# Patient Record
Sex: Female | Born: 1935 | Race: White | Hispanic: No | Marital: Married | Smoking: Never smoker
Health system: Southern US, Community
[De-identification: ages and names within clinical notes are randomized; demographics above are authoritative.]

---

## 1998-09-25 ENCOUNTER — Other Ambulatory Visit: Admission: RE | Admit: 1998-09-25 | Discharge: 1998-09-25 | Payer: Self-pay | Admitting: Obstetrics & Gynecology

## 2004-10-02 ENCOUNTER — Encounter: Admission: RE | Admit: 2004-10-02 | Discharge: 2004-10-02 | Payer: Self-pay | Admitting: General Surgery

## 2004-10-02 ENCOUNTER — Encounter (INDEPENDENT_AMBULATORY_CARE_PROVIDER_SITE_OTHER): Payer: Self-pay | Admitting: *Deleted

## 2004-10-02 ENCOUNTER — Encounter (INDEPENDENT_AMBULATORY_CARE_PROVIDER_SITE_OTHER): Payer: Self-pay | Admitting: General Surgery

## 2004-10-09 ENCOUNTER — Encounter: Admission: RE | Admit: 2004-10-09 | Discharge: 2004-10-09 | Payer: Self-pay | Admitting: General Surgery

## 2004-10-15 ENCOUNTER — Encounter: Admission: RE | Admit: 2004-10-15 | Discharge: 2004-10-15 | Payer: Self-pay | Admitting: General Surgery

## 2004-10-16 ENCOUNTER — Encounter (INDEPENDENT_AMBULATORY_CARE_PROVIDER_SITE_OTHER): Payer: Self-pay | Admitting: *Deleted

## 2004-10-16 ENCOUNTER — Encounter (INDEPENDENT_AMBULATORY_CARE_PROVIDER_SITE_OTHER): Payer: Self-pay | Admitting: General Surgery

## 2004-10-16 ENCOUNTER — Ambulatory Visit (HOSPITAL_COMMUNITY): Admission: RE | Admit: 2004-10-16 | Discharge: 2004-10-16 | Payer: Self-pay | Admitting: General Surgery

## 2004-10-16 ENCOUNTER — Ambulatory Visit (HOSPITAL_BASED_OUTPATIENT_CLINIC_OR_DEPARTMENT_OTHER): Admission: RE | Admit: 2004-10-16 | Discharge: 2004-10-16 | Payer: Self-pay | Admitting: General Surgery

## 2004-10-16 ENCOUNTER — Encounter: Admission: RE | Admit: 2004-10-16 | Discharge: 2004-10-16 | Payer: Self-pay | Admitting: General Surgery

## 2004-10-21 ENCOUNTER — Ambulatory Visit: Payer: Self-pay | Admitting: Oncology

## 2004-11-02 ENCOUNTER — Encounter (INDEPENDENT_AMBULATORY_CARE_PROVIDER_SITE_OTHER): Payer: Self-pay | Admitting: Specialist

## 2004-11-02 ENCOUNTER — Ambulatory Visit (HOSPITAL_COMMUNITY): Admission: RE | Admit: 2004-11-02 | Discharge: 2004-11-02 | Payer: Self-pay | Admitting: General Surgery

## 2004-11-02 ENCOUNTER — Ambulatory Visit (HOSPITAL_BASED_OUTPATIENT_CLINIC_OR_DEPARTMENT_OTHER): Admission: RE | Admit: 2004-11-02 | Discharge: 2004-11-02 | Payer: Self-pay | Admitting: General Surgery

## 2004-11-24 ENCOUNTER — Ambulatory Visit: Admission: RE | Admit: 2004-11-24 | Discharge: 2005-01-29 | Payer: Self-pay | Admitting: Radiation Oncology

## 2005-02-12 ENCOUNTER — Ambulatory Visit: Payer: Self-pay | Admitting: Oncology

## 2005-02-18 ENCOUNTER — Ambulatory Visit: Admission: RE | Admit: 2005-02-18 | Discharge: 2005-02-18 | Payer: Self-pay | Admitting: Radiation Oncology

## 2005-04-16 ENCOUNTER — Ambulatory Visit: Payer: Self-pay | Admitting: Oncology

## 2005-07-16 ENCOUNTER — Ambulatory Visit: Payer: Self-pay | Admitting: Oncology

## 2005-09-24 ENCOUNTER — Encounter: Admission: RE | Admit: 2005-09-24 | Discharge: 2005-09-24 | Payer: Self-pay | Admitting: Oncology

## 2005-11-15 ENCOUNTER — Ambulatory Visit: Payer: Self-pay | Admitting: Oncology

## 2006-03-22 ENCOUNTER — Ambulatory Visit: Payer: Self-pay | Admitting: Oncology

## 2006-03-28 LAB — CBC WITH DIFFERENTIAL/PLATELET
EOS%: 1.5 % (ref 0.0–7.0)
LYMPH%: 31.3 % (ref 14.0–48.0)
MCH: 31 pg (ref 26.0–34.0)
MCV: 89.2 fL (ref 81.0–101.0)
MONO%: 11 % (ref 0.0–13.0)
RBC: 4.37 10*6/uL (ref 3.70–5.32)
RDW: 12.6 % (ref 11.3–14.5)

## 2006-03-28 LAB — CANCER ANTIGEN 27.29: CA 27.29: 47 U/mL — ABNORMAL HIGH (ref 0–39)

## 2006-03-28 LAB — BASIC METABOLIC PANEL
BUN: 13 mg/dL (ref 6–23)
Calcium: 9.4 mg/dL (ref 8.4–10.5)
Potassium: 3.6 mEq/L (ref 3.5–5.3)
Sodium: 139 mEq/L (ref 135–145)

## 2006-06-23 ENCOUNTER — Ambulatory Visit: Payer: Self-pay | Admitting: Oncology

## 2006-06-28 LAB — CBC WITH DIFFERENTIAL/PLATELET
Basophils Absolute: 0 10*3/uL (ref 0.0–0.1)
Eosinophils Absolute: 0.1 10*3/uL (ref 0.0–0.5)
HCT: 38.3 % (ref 34.8–46.6)
HGB: 13.4 g/dL (ref 11.6–15.9)
LYMPH%: 36.1 % (ref 14.0–48.0)
MONO#: 0.8 10*3/uL (ref 0.1–0.9)
NEUT#: 3.7 10*3/uL (ref 1.5–6.5)
NEUT%: 50.7 % (ref 39.6–76.8)
Platelets: 277 10*3/uL (ref 145–400)
WBC: 7.3 10*3/uL (ref 3.9–10.0)

## 2006-06-28 LAB — COMPREHENSIVE METABOLIC PANEL
ALT: 15 U/L (ref 0–40)
AST: 21 U/L (ref 0–37)
Albumin: 4.3 g/dL (ref 3.5–5.2)
Alkaline Phosphatase: 105 U/L (ref 39–117)
Glucose, Bld: 100 mg/dL — ABNORMAL HIGH (ref 70–99)
Potassium: 3.9 mEq/L (ref 3.5–5.3)
Sodium: 139 mEq/L (ref 135–145)
Total Protein: 7.5 g/dL (ref 6.0–8.3)

## 2006-06-28 LAB — CANCER ANTIGEN 27.29: CA 27.29: 44 U/mL — ABNORMAL HIGH (ref 0–39)

## 2006-09-26 ENCOUNTER — Encounter: Admission: RE | Admit: 2006-09-26 | Discharge: 2006-09-26 | Payer: Self-pay | Admitting: Oncology

## 2006-11-04 ENCOUNTER — Encounter: Admission: RE | Admit: 2006-11-04 | Discharge: 2006-11-04 | Payer: Self-pay | Admitting: General Surgery

## 2006-11-28 IMAGING — CR DG CHEST 2V
2 series · 2 of 2 positions shown · non-contrast
Comparison: None.

CLINICAL DATA: Preop respiratory exam for breast cancer.
 TWO VIEW CHEST:

[view not recorded (1 of 2)]
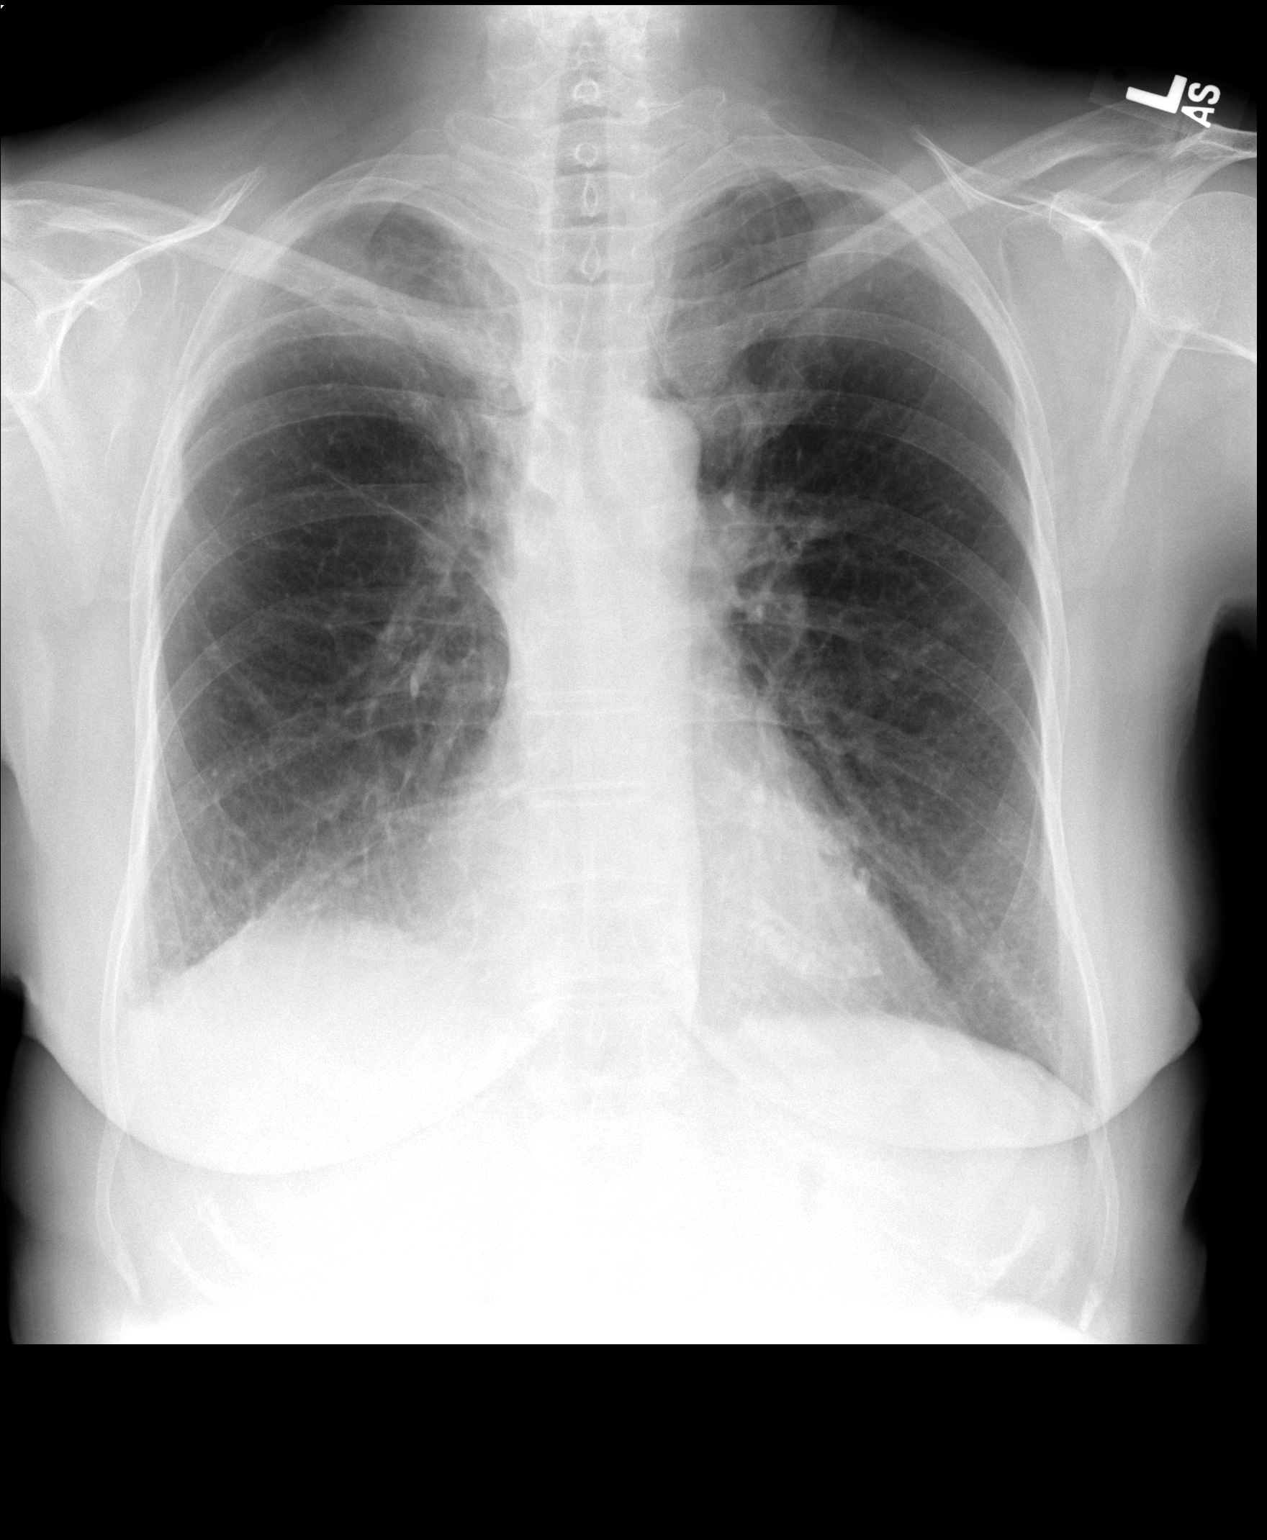

[view not recorded (2 of 2)]
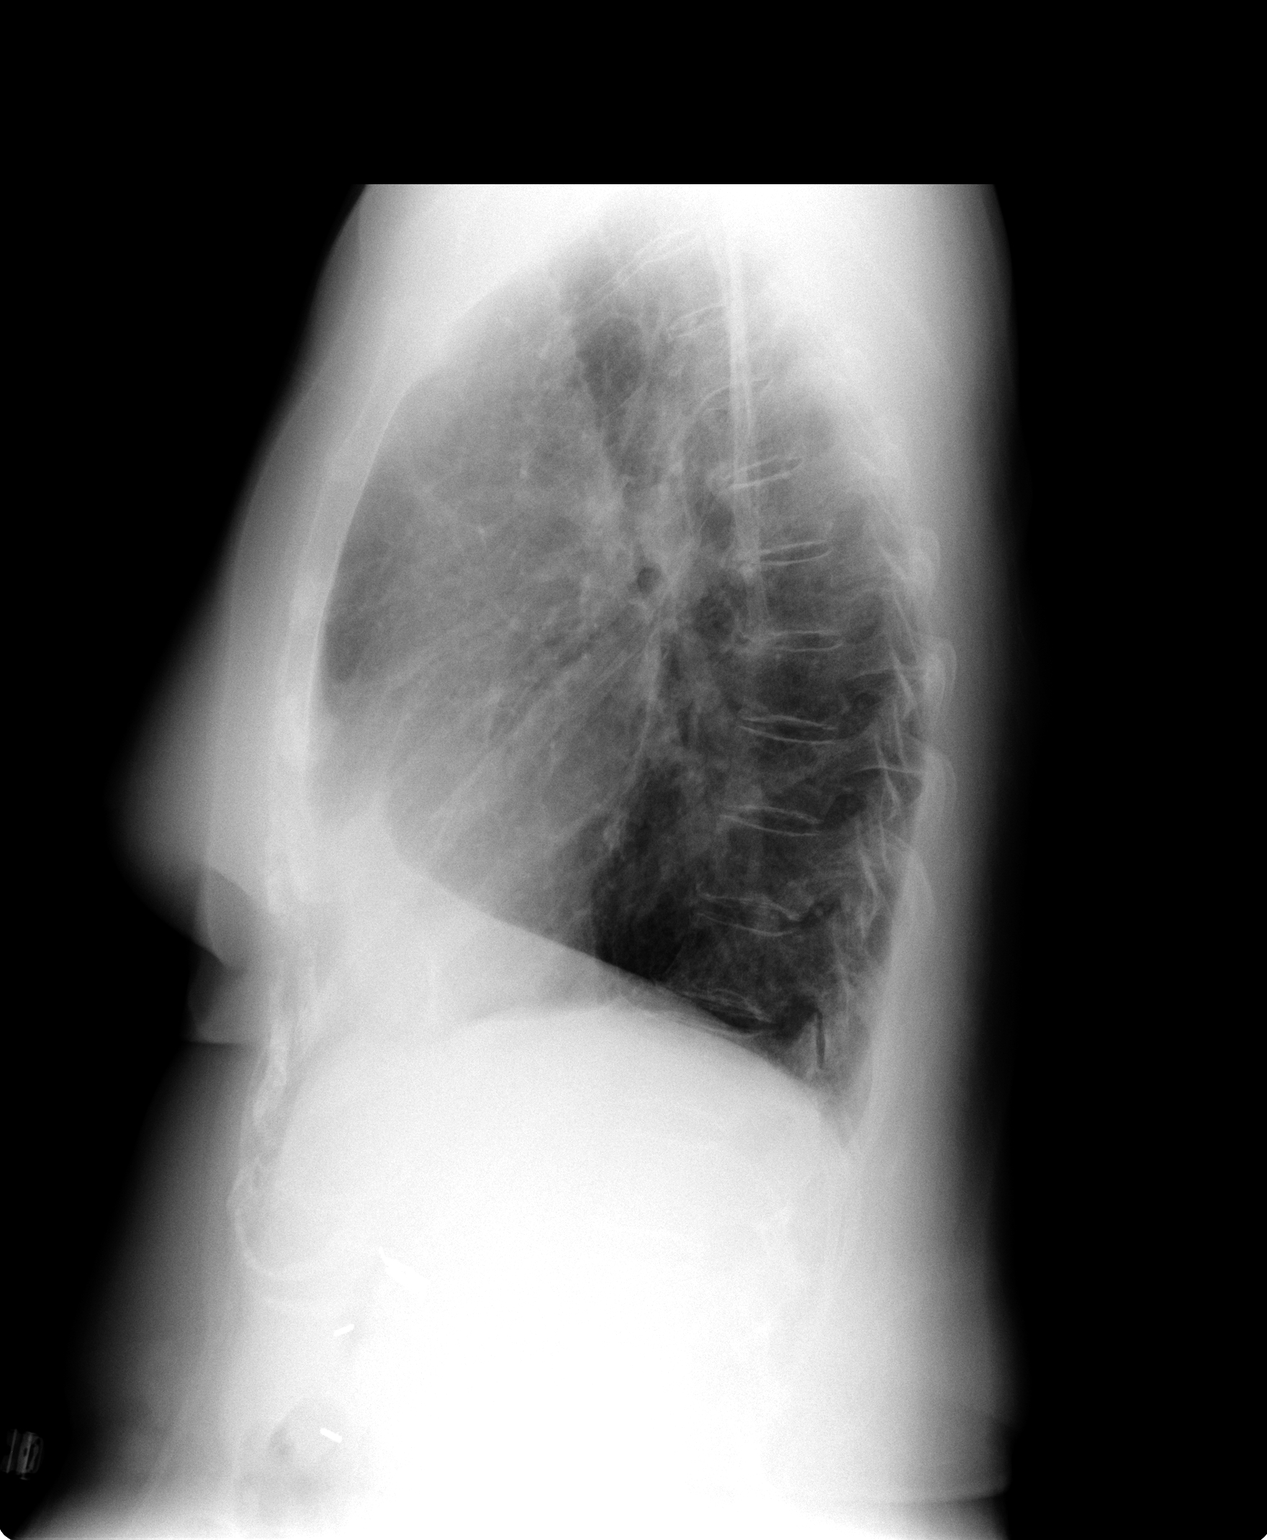

[2 of 2 positions shown; findings below may reference images not displayed]

FINDINGS: Heart size and contour are normal.  COPD with accentuated markings at both bases, right greater than left.  There is also some right upper lobe retraction with elevation of the minor fissure, and some right apical pleuroparenchymal scarring.  All of these changes are likely chronic, but I do not have prior films for comparison.  According to the history, the patient has a history of TB and certainly the changes in the right upper lobe suggest that diagnosis.   The patient also appears to have had resection of the right fourth rib.
IMPRESSION: Chronic changes, including likely old right upper lobe tuberculosis.  No definite acute process.

## 2006-12-29 ENCOUNTER — Ambulatory Visit: Payer: Self-pay | Admitting: Oncology

## 2007-01-03 LAB — CBC WITH DIFFERENTIAL/PLATELET
BASO%: 0.5 % (ref 0.0–2.0)
EOS%: 1.2 % (ref 0.0–7.0)
HCT: 39 % (ref 34.8–46.6)
HGB: 13.5 g/dL (ref 11.6–15.9)
MCHC: 34.6 g/dL (ref 32.0–36.0)
MONO#: 0.8 10*3/uL (ref 0.1–0.9)
NEUT%: 49.4 % (ref 39.6–76.8)
RDW: 12.5 % (ref 11.3–14.5)
WBC: 6.9 10*3/uL (ref 3.9–10.0)
lymph#: 2.6 10*3/uL (ref 0.9–3.3)

## 2007-01-03 LAB — COMPREHENSIVE METABOLIC PANEL
ALT: 15 U/L (ref 0–35)
AST: 22 U/L (ref 0–37)
Albumin: 4.3 g/dL (ref 3.5–5.2)
CO2: 29 mEq/L (ref 19–32)
Calcium: 9.5 mg/dL (ref 8.4–10.5)
Chloride: 100 mEq/L (ref 96–112)
Creatinine, Ser: 0.76 mg/dL (ref 0.40–1.20)
Potassium: 4.4 mEq/L (ref 3.5–5.3)
Total Protein: 7.9 g/dL (ref 6.0–8.3)

## 2007-01-03 LAB — CANCER ANTIGEN 27.29: CA 27.29: 47 U/mL — ABNORMAL HIGH (ref 0–39)

## 2007-07-07 ENCOUNTER — Ambulatory Visit: Payer: Self-pay | Admitting: Oncology

## 2007-07-11 LAB — COMPREHENSIVE METABOLIC PANEL
ALT: 13 U/L (ref 0–35)
Albumin: 4.3 g/dL (ref 3.5–5.2)
CO2: 26 mEq/L (ref 19–32)
Calcium: 9.4 mg/dL (ref 8.4–10.5)
Chloride: 96 mEq/L (ref 96–112)
Creatinine, Ser: 0.79 mg/dL (ref 0.40–1.20)
Total Protein: 7.9 g/dL (ref 6.0–8.3)

## 2007-07-11 LAB — CBC WITH DIFFERENTIAL/PLATELET
BASO%: 0.6 % (ref 0.0–2.0)
HCT: 38.6 % (ref 34.8–46.6)
HGB: 13.8 g/dL (ref 11.6–15.9)
MCHC: 35.7 g/dL (ref 32.0–36.0)
MONO#: 0.7 10*3/uL (ref 0.1–0.9)
NEUT%: 55.6 % (ref 39.6–76.8)
WBC: 7.8 10*3/uL (ref 3.9–10.0)
lymph#: 2.6 10*3/uL (ref 0.9–3.3)

## 2007-07-11 LAB — CANCER ANTIGEN 27.29: CA 27.29: 39 U/mL (ref 0–39)

## 2007-09-29 ENCOUNTER — Encounter: Admission: RE | Admit: 2007-09-29 | Discharge: 2007-09-29 | Payer: Self-pay | Admitting: Oncology

## 2008-01-05 ENCOUNTER — Ambulatory Visit: Payer: Self-pay | Admitting: Oncology

## 2008-07-09 ENCOUNTER — Ambulatory Visit: Payer: Self-pay | Admitting: Oncology

## 2008-07-11 LAB — COMPREHENSIVE METABOLIC PANEL
AST: 18 U/L (ref 0–37)
Alkaline Phosphatase: 97 U/L (ref 39–117)
BUN: 16 mg/dL (ref 6–23)
Glucose, Bld: 81 mg/dL (ref 70–99)
Total Bilirubin: 0.5 mg/dL (ref 0.3–1.2)

## 2008-07-11 LAB — CBC WITH DIFFERENTIAL/PLATELET
Basophils Absolute: 0 10*3/uL (ref 0.0–0.1)
EOS%: 1.5 % (ref 0.0–7.0)
Eosinophils Absolute: 0.1 10*3/uL (ref 0.0–0.5)
HGB: 13.2 g/dL (ref 11.6–15.9)
LYMPH%: 35.7 % (ref 14.0–48.0)
MCH: 31.4 pg (ref 26.0–34.0)
MCV: 90.2 fL (ref 81.0–101.0)
MONO%: 10.4 % (ref 0.0–13.0)
NEUT#: 3.3 10*3/uL (ref 1.5–6.5)
Platelets: 269 10*3/uL (ref 145–400)
RBC: 4.22 10*6/uL (ref 3.70–5.32)

## 2008-10-01 ENCOUNTER — Encounter: Admission: RE | Admit: 2008-10-01 | Discharge: 2008-10-01 | Payer: Self-pay | Admitting: Oncology

## 2009-01-06 ENCOUNTER — Ambulatory Visit: Payer: Self-pay | Admitting: Oncology

## 2009-01-08 LAB — CBC WITH DIFFERENTIAL/PLATELET
EOS%: 1.8 % (ref 0.0–7.0)
LYMPH%: 29.2 % (ref 14.0–49.7)
MCH: 31.8 pg (ref 25.1–34.0)
MCV: 91.8 fL (ref 79.5–101.0)
MONO%: 12.1 % (ref 0.0–14.0)
Platelets: 213 10*3/uL (ref 145–400)
RBC: 4.46 10*6/uL (ref 3.70–5.45)
RDW: 12.4 % (ref 11.2–14.5)

## 2009-01-08 LAB — COMPREHENSIVE METABOLIC PANEL
AST: 18 U/L (ref 0–37)
Albumin: 4.4 g/dL (ref 3.5–5.2)
Alkaline Phosphatase: 95 U/L (ref 39–117)
BUN: 18 mg/dL (ref 6–23)
Potassium: 4 mEq/L (ref 3.5–5.3)
Sodium: 138 mEq/L (ref 135–145)
Total Bilirubin: 0.6 mg/dL (ref 0.3–1.2)
Total Protein: 7.8 g/dL (ref 6.0–8.3)

## 2009-01-08 LAB — CANCER ANTIGEN 27.29: CA 27.29: 44 U/mL — ABNORMAL HIGH (ref 0–39)

## 2009-10-02 ENCOUNTER — Encounter: Admission: RE | Admit: 2009-10-02 | Discharge: 2009-10-02 | Payer: Self-pay | Admitting: Oncology

## 2010-01-16 ENCOUNTER — Ambulatory Visit: Payer: Self-pay | Admitting: Oncology

## 2010-01-20 LAB — COMPREHENSIVE METABOLIC PANEL
ALT: 14 U/L (ref 0–35)
AST: 22 U/L (ref 0–37)
Albumin: 4 g/dL (ref 3.5–5.2)
Alkaline Phosphatase: 78 U/L (ref 39–117)
BUN: 25 mg/dL — ABNORMAL HIGH (ref 6–23)
CO2: 29 mEq/L (ref 19–32)
Calcium: 9.4 mg/dL (ref 8.4–10.5)
Chloride: 103 mEq/L (ref 96–112)
Creatinine, Ser: 0.81 mg/dL (ref 0.40–1.20)
Glucose, Bld: 79 mg/dL (ref 70–99)
Potassium: 4.1 mEq/L (ref 3.5–5.3)
Sodium: 138 mEq/L (ref 135–145)
Total Bilirubin: 0.7 mg/dL (ref 0.3–1.2)
Total Protein: 8 g/dL (ref 6.0–8.3)

## 2010-01-20 LAB — CBC WITH DIFFERENTIAL/PLATELET
BASO%: 0.4 % (ref 0.0–2.0)
Basophils Absolute: 0 10*3/uL (ref 0.0–0.1)
EOS%: 2.6 % (ref 0.0–7.0)
Eosinophils Absolute: 0.2 10*3/uL (ref 0.0–0.5)
HCT: 41.5 % (ref 34.8–46.6)
HGB: 14.1 g/dL (ref 11.6–15.9)
LYMPH%: 30.3 % (ref 14.0–49.7)
MCH: 31.4 pg (ref 25.1–34.0)
MCHC: 33.8 g/dL (ref 31.5–36.0)
MCV: 92.8 fL (ref 79.5–101.0)
MONO#: 0.8 10*3/uL (ref 0.1–0.9)
MONO%: 11.5 % (ref 0.0–14.0)
NEUT#: 3.9 10*3/uL (ref 1.5–6.5)
NEUT%: 55.2 % (ref 38.4–76.8)
Platelets: 227 10*3/uL (ref 145–400)
RBC: 4.48 10*6/uL (ref 3.70–5.45)
RDW: 12.7 % (ref 11.2–14.5)
WBC: 7.1 10*3/uL (ref 3.9–10.3)
lymph#: 2.2 10*3/uL (ref 0.9–3.3)

## 2010-01-20 LAB — CANCER ANTIGEN 27.29: CA 27.29: 40 U/mL — ABNORMAL HIGH (ref 0–39)

## 2010-10-05 ENCOUNTER — Encounter
Admission: RE | Admit: 2010-10-05 | Discharge: 2010-10-05 | Payer: Self-pay | Source: Home / Self Care | Attending: Oncology | Admitting: Oncology

## 2010-10-28 ENCOUNTER — Ambulatory Visit: Payer: Self-pay | Admitting: Oncology

## 2011-03-08 ENCOUNTER — Other Ambulatory Visit: Payer: Self-pay | Admitting: Oncology

## 2011-03-08 ENCOUNTER — Encounter (HOSPITAL_BASED_OUTPATIENT_CLINIC_OR_DEPARTMENT_OTHER): Payer: Medicare Other | Admitting: Oncology

## 2011-03-08 DIAGNOSIS — Z170 Estrogen receptor positive status [ER+]: Secondary | ICD-10-CM

## 2011-03-08 DIAGNOSIS — Z803 Family history of malignant neoplasm of breast: Secondary | ICD-10-CM

## 2011-03-08 DIAGNOSIS — Z9889 Other specified postprocedural states: Secondary | ICD-10-CM

## 2011-03-08 DIAGNOSIS — C50119 Malignant neoplasm of central portion of unspecified female breast: Secondary | ICD-10-CM

## 2011-03-08 LAB — COMPREHENSIVE METABOLIC PANEL
ALT: 13 U/L (ref 0–35)
AST: 23 U/L (ref 0–37)
Albumin: 4.2 g/dL (ref 3.5–5.2)
Alkaline Phosphatase: 87 U/L (ref 39–117)
BUN: 19 mg/dL (ref 6–23)
Calcium: 9.2 mg/dL (ref 8.4–10.5)
Chloride: 95 mEq/L — ABNORMAL LOW (ref 96–112)
Creatinine, Ser: 0.69 mg/dL (ref 0.40–1.20)
Potassium: 3.9 mEq/L (ref 3.5–5.3)
Sodium: 133 mEq/L — ABNORMAL LOW (ref 135–145)
Total Bilirubin: 0.6 mg/dL (ref 0.3–1.2)
Total Protein: 7.8 g/dL (ref 6.0–8.3)

## 2011-03-08 LAB — CBC WITH DIFFERENTIAL/PLATELET
BASO%: 0.4 % (ref 0.0–2.0)
Basophils Absolute: 0 10*3/uL (ref 0.0–0.1)
EOS%: 1.5 % (ref 0.0–7.0)
HCT: 41.1 % (ref 34.8–46.6)
HGB: 14.1 g/dL (ref 11.6–15.9)
LYMPH%: 33.4 % (ref 14.0–49.7)
MCH: 31.6 pg (ref 25.1–34.0)
MCHC: 34.4 g/dL (ref 31.5–36.0)
MCV: 91.9 fL (ref 79.5–101.0)
MONO%: 12.2 % (ref 0.0–14.0)
NEUT%: 52.5 % (ref 38.4–76.8)
Platelets: 187 10*3/uL (ref 145–400)
RBC: 4.47 10*6/uL (ref 3.70–5.45)
RDW: 12.8 % (ref 11.2–14.5)
WBC: 5.7 10*3/uL (ref 3.9–10.3)
lymph#: 1.9 10*3/uL (ref 0.9–3.3)

## 2011-03-12 NOTE — Op Note (Signed)
Hailey Werner, Hailey Werner               ACCOUNT NO.:  [id number]   MEDICAL RECORD NO.:  [id number]          PATIENT TYPE:  AMB   LOCATION:  DSC                          FACILITY:  [facility]   PHYSICIAN:  Rose Phi. Maple Hudson, M.D.   DATE OF BIRTH:  06/22/1927   DATE OF PROCEDURE:  11/02/2004  DATE OF DISCHARGE:                                 OPERATIVE REPORT   PREOPERATIVE DIAGNOSIS:  T1BN0 carcinoma of the left breast.   POSTOPERATIVE DIAGNOSIS:  T1BN0 carcinoma of the left breast.   OPERATION:  Re-excision medial portion of lumpectomy site for margin.   SURGEON:  Rose Phi. Maple Hudson, M.D.   ANESTHESIA:  General.   OPERATIVE PROCEDURE:  After suitable general anesthesia was induced, the  patient was placed in supine position with the arms extended on an arm  board. The left breast was prepped and draped in usual fashion. I opened the  old lumpectomy incision in the medial portion of the left breast and then  evacuated the seroma and then thoroughly irrigated it.  It was the medial  margin that was in question, so I excised that margin. Hemostasis obtained  with cautery. We then thoroughly again irrigated the space.   Incision was then closed with 3-0 Vicryl and subcuticular 4-0 Monocryl and  Steri-Strips.   Dressing applied. The patient transferred to recovery room in satisfactory  condition having tolerated procedure well.      Pete   PRY/MEDQ  D:  11/02/2004  T:  11/02/2004  Job:  [id number]

## 2011-03-26 ENCOUNTER — Encounter (INDEPENDENT_AMBULATORY_CARE_PROVIDER_SITE_OTHER): Payer: Self-pay | Admitting: General Surgery

## 2011-09-20 ENCOUNTER — Telehealth (INDEPENDENT_AMBULATORY_CARE_PROVIDER_SITE_OTHER): Payer: Self-pay | Admitting: General Surgery

## 2011-09-23 ENCOUNTER — Ambulatory Visit (INDEPENDENT_AMBULATORY_CARE_PROVIDER_SITE_OTHER): Payer: Self-pay | Admitting: General Surgery

## 2011-10-07 ENCOUNTER — Ambulatory Visit
Admission: RE | Admit: 2011-10-07 | Discharge: 2011-10-07 | Disposition: A | Discharge status: Home / Self Care | Payer: Medicare Other | Source: Ambulatory Visit | Attending: Oncology | Admitting: Oncology

## 2011-10-07 DIAGNOSIS — Z9889 Other specified postprocedural states: Secondary | ICD-10-CM

## 2011-11-29 ENCOUNTER — Telehealth: Payer: Self-pay | Admitting: Oncology

## 2011-11-29 NOTE — Telephone Encounter (Signed)
Pt called to schedule her yrly appt since she is due in may 2013. Pt requested a sooner appt due to she has missed her appts with the internal md

## 2012-01-10 ENCOUNTER — Ambulatory Visit (HOSPITAL_BASED_OUTPATIENT_CLINIC_OR_DEPARTMENT_OTHER): Payer: Medicare Other | Admitting: Oncology

## 2012-01-10 ENCOUNTER — Telehealth: Payer: Self-pay | Admitting: Oncology

## 2012-01-10 ENCOUNTER — Other Ambulatory Visit (HOSPITAL_BASED_OUTPATIENT_CLINIC_OR_DEPARTMENT_OTHER): Payer: Medicare Other | Admitting: Lab

## 2012-01-10 VITALS — BP 174/72 | HR 67 | Temp 97.6°F | Ht 64.0 in | Wt 132.4 lb

## 2012-01-10 DIAGNOSIS — Z853 Personal history of malignant neoplasm of breast: Secondary | ICD-10-CM

## 2012-01-10 DIAGNOSIS — C50919 Malignant neoplasm of unspecified site of unspecified female breast: Secondary | ICD-10-CM

## 2012-01-10 DIAGNOSIS — C50119 Malignant neoplasm of central portion of unspecified female breast: Secondary | ICD-10-CM

## 2012-01-10 DIAGNOSIS — Z170 Estrogen receptor positive status [ER+]: Secondary | ICD-10-CM

## 2012-01-10 LAB — COMPREHENSIVE METABOLIC PANEL
ALT: 12 U/L (ref 0–35)
Albumin: 4.2 g/dL (ref 3.5–5.2)
CO2: 32 mEq/L (ref 19–32)
Calcium: 9.9 mg/dL (ref 8.4–10.5)
Chloride: 91 mEq/L — ABNORMAL LOW (ref 96–112)
Creatinine, Ser: 0.65 mg/dL (ref 0.50–1.10)
Potassium: 4.4 mEq/L (ref 3.5–5.3)
Sodium: 130 mEq/L — ABNORMAL LOW (ref 135–145)
Total Protein: 8.6 g/dL — ABNORMAL HIGH (ref 6.0–8.3)

## 2012-01-10 LAB — CBC WITH DIFFERENTIAL/PLATELET
BASO%: 0.4 % (ref 0.0–2.0)
HCT: 41.2 % (ref 34.8–46.6)
MCHC: 33.7 g/dL (ref 31.5–36.0)
MONO#: 1 10*3/uL — ABNORMAL HIGH (ref 0.1–0.9)
NEUT%: 65.4 % (ref 38.4–76.8)
WBC: 8.2 10*3/uL (ref 3.9–10.3)
lymph#: 1.8 10*3/uL (ref 0.9–3.3)

## 2012-01-10 LAB — CANCER ANTIGEN 27.29: CA 27.29: 51 U/mL — ABNORMAL HIGH (ref 0–39)

## 2012-01-10 NOTE — Progress Notes (Signed)
ID: Efraim Kaufmann   DOB: 31-Jul-1927  MR#: [id number]  WUJ#:[id number]  HISTORY OF PRESENT ILLNESS: The patient had a routine mammogram at [facility] on 09-14-04 which showed a nodule centrally in the left breast, not seen previously.  She was referred [facility] where on 10-02-04 she had an ultrasound of the left breast which did not show the spiculated lesion seen by mammography.  Biopsy was obtained on 10-02-04 and showed ([id number]) an invasive mammary carcinoma that was ER positive, weakly PR positive with a low proliferation marker and borderline on HER-2 (the final HER-2 is still pending).  With this information, the patient proceeded to resection under Dr. Francina Ames and on 10-16-04 underwent left lumpectomy with sentinel lymph node biopsy.  This showed ([id number]) a 0.8 cm. infiltrating ductal carcinoma with a second 0.3 cm. focus in the same quadrant.  Margins were focally involved.  There was no lymphovascular invasion. The Grade was 1 and in the sentinel lymph node there was a 0.4 mm. micrometastasis seen only by cytokeratin.  The patient accordingly stages as T1a, N0 (i+)  She had a second resection for a clear margin which was obtained on 11-02-04 ([id number]).   INTERVAL HISTORY: The patient returns for routine followup of her breast cancer. Interval history is unremarkable. Family is doing well, including her husband who has a history of metastatic Merckel cell carcinoma  REVIEW OF SYSTEMS: She continues to have problems with irritable bowel, but this is nothing new. Sometimes she has a little bit of pain over the surgical site. This comes and goes, is very brief coming constant, and has not really changed in intensity or frequency in many years. A detailed review of systems is otherwise noncontributory  PAST MEDICAL HISTORY: Significant for mild hypercholesterolemia, osteopenia/osteoporosis (she is not quite sure of the extent of bone loss), mild osteoarthritis,  coronary artery disease followed by Dr. Dulce Sellar in [place], COPD, status post remote tobacco abuse but quitting approximately 22 years ago, and less than 10 pack years total, hypertension controlled on medication, history of tuberculosis at 78, in a sanitorium from September 1954 to November 1955, status post removal of her left upper lobe at that time, history of colonic polyps, history of mucus colitis, history of glaucoma, history of bilateral cataract surgery, history of cholecystectomy, history of appendectomy, history of simple hysterectomy without salpingo-oophorectomy and history of "mini strokes" (TIA's apparently).    FAMILY HISTORY The patient's father died at the age of 76 from a stroke.  The patient's mother died at the age of 76 with breast cancer which was diagnosed at age 76. The patient has one sister who died with breast cancer, also diagnosed at age 76.  Another sister died from a stroke.  One brother died from lung cancer and a brother died at age 84 after cardiac surgery.   GYNECOLOGIC HISTORY: She is G3, P2. She lost a pair of twins at age 78, first delivery to term age 32.  She had her hysterectomy at age 69. She never took hormone therapy.   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  Their daughter, Donzetta Kohut, works in OT at [employer] and their son, Brett Canales, age 76, lives in [place] and works for [employer].  The patient has three grandchildren. She is a member of the [employer].   ADVANCED DIRECTIVES:  HEALTH MAINTENANCE: History  Substance Use Topics  . Smoking  status: Unknown If Ever Smoked  . Smokeless tobacco: Not on file  . Alcohol Use: No     Colonoscopy: (Tim Misenheimer)  PAP:  Lipid panel: Norman Herrlich)  Allergies  Allergen Reactions  . Codeine   . Sulfur     Current Outpatient Prescriptions  Medication Sig Dispense Refill  . Candesartan  Cilexetil (ATACAND PO) Take by mouth.        . Carboxymethylcellulose Sodium (THERATEARS OP) Apply to eye as needed.      Marland Kitchen Clopidogrel Bisulfate (PLAVIX PO) Take by mouth.        . diltiazem (DILACOR XR) 120 MG 24 hr capsule Take 120 mg by mouth daily. Not sure of dose        OBJECTIVE: Elderly white female who appears well  Filed Vitals:   01/10/12 1353  BP: 174/72  Pulse: 67  Temp: 97.6 F (36.4 C)     Body mass index is 22.73 kg/(m^2).    ECOG FS: 1  Sclerae unicteric Oropharynx clear No peripheral adenopathy Lungs no rales or rhonchi Heart regular rate and rhythm Abd benign MSK no focal spinal tenderness, no peripheral edema Neuro: nonfocal Breasts: Right breast no suspicious findings; left breast status post lumpectomy; no evidence of local recurrence  LAB RESULTS: Lab Results  Component Value Date   WBC 8.2 01/10/2012   NEUTROABS 5.4 01/10/2012   HGB 13.9 01/10/2012   HCT 41.2 01/10/2012   MCV 92.1 01/10/2012   PLT 243 01/10/2012      Chemistry      Component Value Date/Time   NA 133* 03/08/2011 1028   K 3.9 03/08/2011 1028   CL 95* 03/08/2011 1028   CO2 28 03/08/2011 1028   BUN 19 03/08/2011 1028   CREATININE 0.69 03/08/2011 1028      Component Value Date/Time   CALCIUM 9.2 03/08/2011 1028   ALKPHOS 87 03/08/2011 1028   AST 23 03/08/2011 1028   ALT 13 03/08/2011 1028   BILITOT 0.6 03/08/2011 1028       Lab Results  Component Value Date   LABCA2 42* 03/08/2011    No components found with this basename: [id number]    No results found for this basename: INR:1;PROTIME:1 in the last 168 hours  Urinalysis No results found for this basename: colorurine, appearanceur, labspec, phurine, glucoseu, hgbur, bilirubinur, ketonesur, proteinur, urobilinogen, nitrite, leukocytesur    STUDIES: Unremarkable mammography December of 20  ASSESSMENT:An 78 year old [place], [place] woman status post left lumpectomy and sentinel lymph node biopsy in December 2006 for  multifocal invasive ductal carcinoma measuring maximal 8 mm.  No lymphovascular invasion, no lymph node involvement.  Grade 1, ER positive, PR weakly positive and HER2/neu negative.  Did not tolerate Aromasin or Arimidex.  Now on observation alone given cardiac history.    PLAN: The patient is doing very well from a breast cancer point of view and indeed she would be a good candidate for our survivorship clinic, except that she prefers to be seen on a once a year basis as before. She will see my physician's assistant again in one year. She will have a repeat mammography December of this year. She knows to call for any problems that may develop before the next visit   Kwali Wrinkle C    01/10/2012

## 2012-01-10 NOTE — Telephone Encounter (Signed)
gve the pt her march 2014 appt calendar °

## 2012-01-18 ENCOUNTER — Telehealth: Payer: Self-pay | Admitting: *Deleted

## 2012-01-18 NOTE — Telephone Encounter (Signed)
Message left by pt inquiring of result of most recent CA 27.29.  Noted elevation from prior reading.  This note with lab result will be given to MD for review and any recommendation for pt information.

## 2012-01-20 ENCOUNTER — Other Ambulatory Visit: Payer: Self-pay | Admitting: Oncology

## 2012-01-20 DIAGNOSIS — C50919 Malignant neoplasm of unspecified site of unspecified female breast: Secondary | ICD-10-CM

## 2012-01-20 NOTE — Progress Notes (Signed)
I called the pt. w her lab results and gave her the CA 27 values for the past several years--there is no clear treand--we are going to check labs in 6 months, no visit; she will see me again in one year

## 2012-01-21 ENCOUNTER — Telehealth: Payer: Self-pay | Admitting: Oncology

## 2012-01-21 NOTE — Telephone Encounter (Signed)
S/w the pt and she is aware of her lab appt in sept °

## 2012-01-31 ENCOUNTER — Other Ambulatory Visit: Payer: Self-pay | Admitting: Oncology

## 2012-07-10 ENCOUNTER — Other Ambulatory Visit (HOSPITAL_BASED_OUTPATIENT_CLINIC_OR_DEPARTMENT_OTHER): Payer: Medicare Other | Admitting: Lab

## 2012-07-10 DIAGNOSIS — C50919 Malignant neoplasm of unspecified site of unspecified female breast: Secondary | ICD-10-CM

## 2012-07-10 DIAGNOSIS — Z853 Personal history of malignant neoplasm of breast: Secondary | ICD-10-CM

## 2012-07-10 LAB — CBC WITH DIFFERENTIAL/PLATELET
BASO%: 0.9 % (ref 0.0–2.0)
EOS%: 2.5 % (ref 0.0–7.0)
MCH: 30.4 pg (ref 25.1–34.0)
MCHC: 33.1 g/dL (ref 31.5–36.0)
MCV: 91.8 fL (ref 79.5–101.0)
MONO%: 11.8 % (ref 0.0–14.0)
RBC: 4.33 10*6/uL (ref 3.70–5.45)
RDW: 13.2 % (ref 11.2–14.5)
lymph#: 3.1 10*3/uL (ref 0.9–3.3)

## 2012-07-10 LAB — COMPREHENSIVE METABOLIC PANEL (CC13)
AST: 20 U/L (ref 5–34)
Albumin: 3.6 g/dL (ref 3.5–5.0)
Alkaline Phosphatase: 92 U/L (ref 40–150)
Glucose: 97 mg/dl (ref 70–99)
Potassium: 4.4 mEq/L (ref 3.5–5.1)
Sodium: 138 mEq/L (ref 136–145)
Total Bilirubin: 0.5 mg/dL (ref 0.20–1.20)
Total Protein: 7.9 g/dL (ref 6.4–8.3)

## 2012-09-12 ENCOUNTER — Other Ambulatory Visit: Payer: Self-pay | Admitting: Oncology

## 2012-09-12 DIAGNOSIS — Z853 Personal history of malignant neoplasm of breast: Secondary | ICD-10-CM

## 2012-10-11 ENCOUNTER — Ambulatory Visit
Admission: RE | Admit: 2012-10-11 | Discharge: 2012-10-11 | Disposition: A | Discharge status: Home / Self Care | Payer: Medicare Other | Source: Ambulatory Visit | Attending: Oncology | Admitting: Oncology

## 2012-10-11 ENCOUNTER — Other Ambulatory Visit: Payer: Self-pay | Admitting: Oncology

## 2012-10-11 DIAGNOSIS — Z853 Personal history of malignant neoplasm of breast: Secondary | ICD-10-CM

## 2013-01-09 ENCOUNTER — Other Ambulatory Visit: Payer: Medicare Other | Admitting: Lab

## 2013-01-09 ENCOUNTER — Encounter: Payer: Self-pay | Admitting: Physician Assistant

## 2013-01-09 ENCOUNTER — Ambulatory Visit: Payer: Medicare Other | Admitting: Physician Assistant

## 2013-01-09 NOTE — Progress Notes (Signed)
[facility] today.  Letter mailed to patient.   Zollie Scale, PA-C 01/09/2013

## 2013-01-23 ENCOUNTER — Other Ambulatory Visit: Payer: Self-pay | Admitting: *Deleted

## 2013-01-23 DIAGNOSIS — C50919 Malignant neoplasm of unspecified site of unspecified female breast: Secondary | ICD-10-CM

## 2013-01-24 ENCOUNTER — Ambulatory Visit: Payer: Medicare Other | Admitting: Physician Assistant

## 2013-01-24 ENCOUNTER — Other Ambulatory Visit: Payer: Medicare Other | Admitting: Lab

## 2013-01-24 ENCOUNTER — Telehealth: Payer: Self-pay | Admitting: *Deleted

## 2013-01-24 NOTE — Telephone Encounter (Signed)
Called pt to let her know that Amy is sick and cannot be seen today.  Confirmed rescheduled 01/31/13 appt w/ pt.

## 2013-01-25 ENCOUNTER — Other Ambulatory Visit: Payer: Self-pay | Admitting: Physician Assistant

## 2013-01-30 ENCOUNTER — Telehealth: Payer: Self-pay | Admitting: *Deleted

## 2013-01-30 NOTE — Telephone Encounter (Signed)
Pt called stating that he husband is not feeling well and she has to take him to the doctor and cannot make her appt tomorrow.  Confirmed rescheduled 02/21/13 appt w/ pt.

## 2013-01-31 ENCOUNTER — Other Ambulatory Visit: Payer: Medicare Other | Admitting: Lab

## 2013-01-31 ENCOUNTER — Ambulatory Visit: Payer: Medicare Other | Admitting: Physician Assistant

## 2013-02-20 ENCOUNTER — Telehealth: Payer: Self-pay | Admitting: *Deleted

## 2013-02-20 NOTE — Telephone Encounter (Signed)
sw called wanting to cancel her appt for 02/21/13 and to r/s. gv appt d/t for 02/26/13 10:45am lab and to see Amy @11 :15. Pt stated that she could make 02/26/13 appt...td

## 2013-02-21 ENCOUNTER — Ambulatory Visit: Payer: Medicare Other | Admitting: Physician Assistant

## 2013-02-21 ENCOUNTER — Other Ambulatory Visit: Payer: Medicare Other | Admitting: Lab

## 2013-02-26 ENCOUNTER — Ambulatory Visit (HOSPITAL_BASED_OUTPATIENT_CLINIC_OR_DEPARTMENT_OTHER): Payer: Medicare Other | Admitting: Physician Assistant

## 2013-02-26 ENCOUNTER — Encounter: Payer: Self-pay | Admitting: Physician Assistant

## 2013-02-26 ENCOUNTER — Telehealth: Payer: Self-pay | Admitting: *Deleted

## 2013-02-26 ENCOUNTER — Other Ambulatory Visit (HOSPITAL_BASED_OUTPATIENT_CLINIC_OR_DEPARTMENT_OTHER): Payer: Medicare Other | Admitting: Lab

## 2013-02-26 VITALS — BP 113/65 | HR 60 | Temp 97.8°F | Resp 20 | Ht 64.0 in | Wt 133.0 lb

## 2013-02-26 DIAGNOSIS — Z1231 Encounter for screening mammogram for malignant neoplasm of breast: Secondary | ICD-10-CM

## 2013-02-26 DIAGNOSIS — C50919 Malignant neoplasm of unspecified site of unspecified female breast: Secondary | ICD-10-CM

## 2013-02-26 DIAGNOSIS — C50912 Malignant neoplasm of unspecified site of left female breast: Secondary | ICD-10-CM

## 2013-02-26 DIAGNOSIS — Z170 Estrogen receptor positive status [ER+]: Secondary | ICD-10-CM

## 2013-02-26 LAB — CBC WITH DIFFERENTIAL/PLATELET
Basophils Absolute: 0 10*3/uL (ref 0.0–0.1)
EOS%: 0.9 % (ref 0.0–7.0)
HCT: 39.2 % (ref 34.8–46.6)
HGB: 13.2 g/dL (ref 11.6–15.9)
LYMPH%: 33.1 % (ref 14.0–49.7)
MCH: 30.8 pg (ref 25.1–34.0)
MONO#: 0.8 10*3/uL (ref 0.1–0.9)
NEUT%: 55.5 % (ref 38.4–76.8)
Platelets: 264 10*3/uL (ref 145–400)
lymph#: 2.7 10*3/uL (ref 0.9–3.3)

## 2013-02-26 LAB — COMPREHENSIVE METABOLIC PANEL (CC13)
BUN: 21.2 mg/dL (ref 7.0–26.0)
CO2: 28 mEq/L (ref 22–29)
Calcium: 9.4 mg/dL (ref 8.4–10.4)
Chloride: 102 mEq/L (ref 98–107)
Creatinine: 0.9 mg/dL (ref 0.6–1.1)
Total Bilirubin: 0.75 mg/dL (ref 0.20–1.20)

## 2013-02-26 NOTE — Progress Notes (Signed)
ID: Hailey Werner   DOB: 01/01/1927  MR#: [id number]  GUY#:[id number]  HISTORY OF PRESENT ILLNESS: The patient had a routine mammogram at [facility] on 09-14-04 which showed a nodule centrally in the left breast, not seen previously.  [facility] was [facility] where on 10-02-04 she had an ultrasound of the left breast which did not show the spiculated lesion seen by mammography.  Biopsy was obtained on 10-02-04 and showed ([id number]) an invasive mammary carcinoma that was ER positive, weakly PR positive with a low proliferation marker and borderline on HER-2 (the final HER-2 is still pending).  With this information, the patient proceeded to resection under Dr. Francina Ames and on 10-16-04 underwent left lumpectomy with sentinel lymph node biopsy.  This showed ([id number]) a 0.8 cm. infiltrating ductal carcinoma with a second 0.3 cm. focus in the same quadrant.  Margins were focally involved.  There was no lymphovascular invasion. The Grade was 1 and in the sentinel lymph node there was a 0.4 mm. micrometastasis seen only by cytokeratin.  The patient accordingly stages as T1a, N0 (i+)  She had a second resection for a clear margin which was obtained on 11-02-04 ([id number]).   INTERVAL HISTORY: The patient returns for routine followup of her left breast cancer. Interval history is unremarkable. Family is doing well, including her two children and 6 grandchildren, as well as her husband who has a history of metastatic Merckel cell carcinoma.  REVIEW OF SYSTEMS: Hailey Werner  has had no changes in her health condition.  She continues to have problems with irritable bowel, but this is nothing new. She denies any recent illnesses, fevers, or chills.  She has had no recent increase in shortness of breath and denies chest pain.  She fell recently but did not injure herself and is "being more careful".  She denies any pain and has had no abnormal headaches.    A detailed review of systems is  otherwise noncontributory  PAST MEDICAL HISTORY: Significant for mild hypercholesterolemia, osteopenia/osteoporosis (she is not quite sure of the extent of bone loss), mild osteoarthritis, coronary artery disease followed by Dr. Dulce Sellar in [place], COPD, status post remote tobacco abuse but quitting approximately 22 years ago, and less than 10 pack years total, hypertension controlled on medication, history of tuberculosis at age 60, in a sanitorium from September 1954 to November 1955, status post removal of her left upper lobe at that time, history of colonic polyps, history of mucus colitis, history of glaucoma, history of bilateral cataract surgery, history of cholecystectomy, history of appendectomy, history of simple hysterectomy without salpingo-oophorectomy and history of "mini strokes" (TIA's apparently).    FAMILY HISTORY The patient's father died at the age of 5 from a stroke.  The patient's mother died at the age of 24 with breast cancer which was diagnosed at age 25. The patient has one sister who died with breast cancer, also diagnosed at age 77.  Another sister died from a stroke.  One brother died from lung cancer and a brother died at age 70 after cardiac surgery.   GYNECOLOGIC HISTORY: She is G3, P2. She lost a pair of twins at age 36, first delivery to term age 24.  She had her hysterectomy at age 25. She never took hormone therapy.   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  Their daughter, Donzetta Kohut, works in OT at [employer] and their son,  Brett Canales, age 64, lives in [place] and works for [employer].  The patient has three grandchildren. She is a member of the [employer].   ADVANCED DIRECTIVES:  HEALTH MAINTENANCE: History  Substance Use Topics  . Smoking status: Never Smoker   . Smokeless tobacco: Not on file  . Alcohol Use: No     Colonoscopy: (Tim  Misenheimer)  PAP:  Lipid panel: Norman Herrlich)  Allergies  Allergen Reactions  . Codeine   . Sulfur     Current Outpatient Prescriptions  Medication Sig Dispense Refill  . Candesartan Cilexetil (ATACAND PO) Take by mouth.        . Carboxymethylcellulose Sodium (THERATEARS OP) Apply to eye as needed.      Marland Kitchen Clopidogrel Bisulfate (PLAVIX PO) Take by mouth.        . diltiazem (DILACOR XR) 120 MG 24 hr capsule Take 120 mg by mouth daily. Not sure of dose      . hydrALAZINE (APRESOLINE) 10 MG tablet       . metoprolol tartrate (LOPRESSOR) 25 MG tablet        No current facility-administered medications for this visit.    OBJECTIVE: Elderly white female who appears well  Filed Vitals:   02/26/13 1101  BP: 113/65  Pulse: 60  Temp: 97.8 F (36.6 C)  Resp: 20     Body mass index is 22.82 kg/(m^2).    ECOG FS: 1 Filed Weights   02/26/13 1101  Weight: 133 lb (60.328 kg)    Sclerae unicteric Oropharynx clear No peripheral adenopathy Lungs clear to auscultation,  no rales or rhonchi Heart regular rate and rhythm Abdomen, soft, nontender to palpation, positive bowel sounds MSK no focal spinal tenderness, no peripheral edema Neuro: nonfocal, well oriented Breasts: Right breast no suspicious findings; left breast status post lumpectomy; no evidence of local recurrence.    LAB RESULTS: Lab Results  Component Value Date   WBC 8.2 02/26/2013   NEUTROABS 4.6 02/26/2013   HGB 13.2 02/26/2013   HCT 39.2 02/26/2013   MCV 91.3 02/26/2013   PLT 264 02/26/2013      Chemistry      Component Value Date/Time   NA 140 02/26/2013 1042   NA 130* 01/10/2012 1337   K 4.3 02/26/2013 1042   K 4.4 01/10/2012 1337   CL 102 02/26/2013 1042   CL 91* 01/10/2012 1337   CO2 28 02/26/2013 1042   CO2 32 01/10/2012 1337   BUN 21.2 02/26/2013 1042   BUN 15 01/10/2012 1337   CREATININE 0.9 02/26/2013 1042   CREATININE 0.65 01/10/2012 1337      Component Value Date/Time   CALCIUM 9.4 02/26/2013 1042   CALCIUM 9.9  01/10/2012 1337   ALKPHOS 90 02/26/2013 1042   ALKPHOS 92 01/10/2012 1337   AST 19 02/26/2013 1042   AST 23 01/10/2012 1337   ALT 12 02/26/2013 1042   ALT 12 01/10/2012 1337   BILITOT 0.75 02/26/2013 1042   BILITOT 0.5 01/10/2012 1337        STUDIES: Mammogram 10/13/2012 was unremarkable.      ASSESSMENT: An 79 year old [place], [place] woman   (1)  status post left lumpectomy and sentinel lymph node biopsy in December 2006 for multifocal invasive ductal carcinoma measuring maximal 8 mm.  No lymphovascular invasion, no lymph node involvement.  Grade 1, ER positive, PR weakly positive and HER2/neu negative.    (2)  Did not tolerate Aromasin or Arimidex.  Now on observation alone given cardiac  history.    PLAN: The patient continues to do very well from a breast cancer point of view and indeed she could actually graduate from our follow up. She strongly prefers, however,  to continue being seen on a once a year basis, so we will see her one more time next year.  She will have a repeat mammography December 2014 and will see Dr. Darnelle Catalan in April of May of 2015.  She knows to call for any problems that may develop before the next visit   Mena Simonis    02/26/2013

## 2013-02-26 NOTE — Telephone Encounter (Signed)
appts made and printed...td 

## 2013-10-15 ENCOUNTER — Ambulatory Visit: Payer: Medicare Other

## 2013-11-05 ENCOUNTER — Telehealth: Payer: Self-pay | Admitting: *Deleted

## 2013-11-05 NOTE — Telephone Encounter (Signed)
Pt called to this RN due to concern that she needs her yearly mammogram but is unable to come to [facility] due to her son in law had hip surgery and he drives her to appointments.  Hailey Werner was also very concerned due to she pulled out last mammogram report and noted it states " at the bottom of the page it says I have breast cancer unspecified unilatererly , I didn't know I still have cancer ".  This RN informed pt she does not have active cancer but only history of breast cancer - above is for insurance purposes for reason we see her in this office.  Per phone discussion plan is for order for mammogram to be sent to [facility]- radiology for pt to obtain locally.  No further questions per call.

## 2013-11-06 ENCOUNTER — Other Ambulatory Visit: Payer: Self-pay | Admitting: *Deleted

## 2013-11-08 ENCOUNTER — Telehealth: Payer: Self-pay | Admitting: Oncology

## 2013-11-08 NOTE — Telephone Encounter (Signed)
, °

## 2013-11-14 ENCOUNTER — Other Ambulatory Visit: Payer: Self-pay | Admitting: Physician Assistant

## 2013-11-14 DIAGNOSIS — Z853 Personal history of malignant neoplasm of breast: Secondary | ICD-10-CM

## 2013-11-21 ENCOUNTER — Telehealth: Payer: Self-pay

## 2013-11-21 NOTE — Telephone Encounter (Signed)
Lesly Rubenstein from [place] mammography dept called stated a diagnostic mammogram was ordered and the pt's lumpectomy was in 2006 so it is past the time frame needing a diagnostic. Her last mammogram was a screening in 2013. Verifying if we want diagnostic or screening mammogram. S/w Amy Gwenlyn Found PA and received order for screening mammogram. Faxed Rx to [facility].

## 2013-12-27 ENCOUNTER — Encounter: Payer: Self-pay | Admitting: Oncology

## 2014-01-24 ENCOUNTER — Telehealth: Payer: Self-pay | Admitting: Oncology

## 2014-01-24 NOTE — Telephone Encounter (Signed)
, °

## 2014-01-28 ENCOUNTER — Ambulatory Visit: Payer: Medicare Other | Admitting: Oncology

## 2014-01-28 ENCOUNTER — Other Ambulatory Visit: Payer: Medicare Other

## 2014-03-13 ENCOUNTER — Telehealth: Payer: Self-pay | Admitting: Oncology

## 2014-03-13 NOTE — Telephone Encounter (Signed)
returned pt call to give pt time & date for appt-pt understood

## 2014-03-25 ENCOUNTER — Other Ambulatory Visit: Payer: Self-pay | Admitting: *Deleted

## 2014-03-25 DIAGNOSIS — C50919 Malignant neoplasm of unspecified site of unspecified female breast: Secondary | ICD-10-CM

## 2014-03-26 ENCOUNTER — Telehealth: Payer: Self-pay | Admitting: Oncology

## 2014-03-26 ENCOUNTER — Other Ambulatory Visit (HOSPITAL_BASED_OUTPATIENT_CLINIC_OR_DEPARTMENT_OTHER): Payer: Commercial Managed Care - HMO

## 2014-03-26 ENCOUNTER — Ambulatory Visit (HOSPITAL_BASED_OUTPATIENT_CLINIC_OR_DEPARTMENT_OTHER): Payer: Commercial Managed Care - HMO | Admitting: Oncology

## 2014-03-26 VITALS — BP 126/72 | HR 58 | Temp 97.6°F | Resp 18 | Ht 64.0 in | Wt 130.4 lb

## 2014-03-26 DIAGNOSIS — C50919 Malignant neoplasm of unspecified site of unspecified female breast: Secondary | ICD-10-CM

## 2014-03-26 DIAGNOSIS — Z170 Estrogen receptor positive status [ER+]: Secondary | ICD-10-CM

## 2014-03-26 DIAGNOSIS — Z853 Personal history of malignant neoplasm of breast: Secondary | ICD-10-CM

## 2014-03-26 LAB — COMPREHENSIVE METABOLIC PANEL (CC13)
ALBUMIN: 3.9 g/dL (ref 3.5–5.0)
ALT: 13 U/L (ref 0–55)
AST: 17 U/L (ref 5–34)
Alkaline Phosphatase: 75 U/L (ref 40–150)
Anion Gap: 12 mEq/L — ABNORMAL HIGH (ref 3–11)
BUN: 46.4 mg/dL — AB (ref 7.0–26.0)
CALCIUM: 9.6 mg/dL (ref 8.4–10.4)
CHLORIDE: 105 meq/L (ref 98–109)
CO2: 18 mEq/L — ABNORMAL LOW (ref 22–29)
CREATININE: 1.2 mg/dL — AB (ref 0.6–1.1)
GLUCOSE: 95 mg/dL (ref 70–140)
Sodium: 135 mEq/L — ABNORMAL LOW (ref 136–145)
Total Bilirubin: 0.76 mg/dL (ref 0.20–1.20)
Total Protein: 7.6 g/dL (ref 6.4–8.3)

## 2014-03-26 LAB — CBC WITH DIFFERENTIAL/PLATELET
BASO%: 0.6 % (ref 0.0–2.0)
BASOS ABS: 0.1 10*3/uL (ref 0.0–0.1)
EOS ABS: 0.1 10*3/uL (ref 0.0–0.5)
EOS%: 1 % (ref 0.0–7.0)
HEMATOCRIT: 42.1 % (ref 34.8–46.6)
HEMOGLOBIN: 13.9 g/dL (ref 11.6–15.9)
LYMPH#: 2.9 10*3/uL (ref 0.9–3.3)
LYMPH%: 27.3 % (ref 14.0–49.7)
MCH: 31.3 pg (ref 25.1–34.0)
MCHC: 33.1 g/dL (ref 31.5–36.0)
MCV: 94.5 fL (ref 79.5–101.0)
MONO#: 1.1 10*3/uL — ABNORMAL HIGH (ref 0.1–0.9)
MONO%: 9.8 % (ref 0.0–14.0)
NEUT%: 61.3 % (ref 38.4–76.8)
NEUTROS ABS: 6.6 10*3/uL — AB (ref 1.5–6.5)
Platelets: 225 10*3/uL (ref 145–400)
RBC: 4.46 10*6/uL (ref 3.70–5.45)
RDW: 13 % (ref 11.2–14.5)
WBC: 10.7 10*3/uL — ABNORMAL HIGH (ref 3.9–10.3)

## 2014-03-26 NOTE — Progress Notes (Signed)
ID: Denny Peon   DOB: 1927-04-11  MR#: [id number]  QIH#:[id number]  PCP: Wende Neighbors, MD GYN: SU:  OTHER MD:  HISTORY OF PRESENT ILLNESS: From the original intake note:  The patient had a routine mammogram at [facility] on 09-14-04 which showed a nodule centrally in the left breast, not seen previously.  She was referred to [facility] where on 10-02-04 she had an ultrasound of the left breast which did not show the spiculated lesion seen by mammography.  Biopsy was obtained on 10-02-04 and showed ([id number]) an invasive mammary carcinoma that was ER positive, weakly PR positive with a low proliferation marker and borderline on HER-2 (the final HER-2 is still pending).  With this information, the patient proceeded to resection under Dr. Marylene Buerger and on 10-16-04 underwent left lumpectomy with sentinel lymph node biopsy.  This showed ([id number]) a 0.8 cm. infiltrating ductal carcinoma with a second 0.3 cm. focus in the same quadrant.  Margins were focally involved.  There was no lymphovascular invasion. The Grade was 1 and in the sentinel lymph node there was a 0.4 mm. micrometastasis seen only by cytokeratin.  The patient accordingly stages as T1a, N0 (i+)  She had a second resection for a clear margin which was obtained on 11-02-04 ([id number]).   Her subsequent history is as detailed below  INTERVAL HISTORY: Magaly returns today for followup of her remote breast cancer. Overall she is doing well, with no major changes your in her own status are in the family. She does not exercise regularly but "I'm very active" in house work and a little bit of yard work.  REVIEW OF SYSTEMS: There have been no unusual headaches, visual changes, nausea, vomiting, cough, phlegm production, change in bowel or bladder habits, rash, fever, unexplained weight or unexplained fatigue. A detailed review of systems today was noncontributory  PAST MEDICAL HISTORY: Significant for mild  hypercholesterolemia, osteopenia/osteoporosis (she is not quite sure of the extent of bone loss), mild osteoarthritis, coronary artery disease followed by Dr. Bettina Gavia in [place], COPD, status post remote tobacco abuse but quitting approximately 22 years ago, and less than 10 pack years total, hypertension controlled on medication, history of tuberculosis at age 56, in a sanitorium from September 1954 to November 1955, status post removal of her left upper lobe at that time, history of colonic polyps, history of mucus colitis, history of glaucoma, history of bilateral cataract surgery, history of cholecystectomy, history of appendectomy, history of simple hysterectomy without salpingo-oophorectomy and history of "mini strokes" (TIA's apparently).    FAMILY HISTORY The patient's father died at the age of 34 from a stroke.  The patient's mother died at the age of 65 with breast cancer which was diagnosed at age 24. The patient has one sister who died with breast cancer, also diagnosed at age 66.  Another sister died from a stroke.  One brother died from lung cancer and a brother died at age 61 after cardiac surgery.   GYNECOLOGIC HISTORY: She is G3, P2. She lost a pair of twins at age 49, first delivery to term age 56.  She had her hysterectomy at age 35. She never took hormone therapy.   SOCIAL HISTORY: She worked briefly in a Building surveyor. Her husband, Karlton Lemon, is retired from [employer].  Their daughter, Leda Roys, works in OT at [employer] and their son, Richardson Landry, age 61, lives in [place] and works for [employer].  The patient has three grandchildren. She is a member of the [employer].   ADVANCED DIRECTIVES:  HEALTH MAINTENANCE: History  Substance Use Topics  . Smoking status: Never Smoker   . Smokeless tobacco: Not on file  . Alcohol Use: No     Colonoscopy: (Tim Misenheimer)  PAP:  Lipid panel: Shirlee More)  Allergies  Allergen Reactions  . Codeine   . Sulfur      Current Outpatient Prescriptions  Medication Sig Dispense Refill  . Candesartan Cilexetil (ATACAND PO) Take by mouth.        . Carboxymethylcellulose Sodium (THERATEARS OP) Apply to eye as needed.      Marland Kitchen Clopidogrel Bisulfate (PLAVIX PO) Take by mouth.        . diltiazem (DILACOR XR) 120 MG 24 hr capsule Take 120 mg by mouth daily. Not sure of dose      . hydrALAZINE (APRESOLINE) 10 MG tablet       . metoprolol tartrate (LOPRESSOR) 25 MG tablet        No current facility-administered medications for this visit.    OBJECTIVE: Elderly white female in no acute distress Filed Vitals:   03/26/14 1122  BP: 126/72  Pulse: 58  Temp: 97.6 F (36.4 C)  Resp: 18     Body mass index is 22.37 kg/(m^2).    ECOG FS: 0 Filed Weights   03/26/14 1122  Weight: 130 lb 6.4 oz (59.149 kg)    Sclerae unicteric, EOMs intact Oropharynx clear and moist No cervical or supraclavicular adenopathy Lungs no rales or rhonchi  Heart regular rate and rhythm Abdomen, soft, nontender, positive bowel sounds MSK no focal spinal tenderness, no upper extremity lymphedema Neuro: nonfocal, well oriented, anxious affect Breasts: Right breast no suspicious findings. The left breast is status post lumpectomy. There is no evidence of local recurrence. Left axilla is benign    LAB RESULTS: Lab Results  Component Value Date   WBC 10.7* 03/26/2014   NEUTROABS 6.6* 03/26/2014   HGB 13.9 03/26/2014   HCT 42.1 03/26/2014   MCV 94.5 03/26/2014   PLT 225 03/26/2014      Chemistry      Component Value Date/Time   NA 140 02/26/2013 1042   NA 130* 01/10/2012 1337   K 4.3 02/26/2013 1042   K 4.4 01/10/2012 1337   CL 102 02/26/2013 1042   CL 91* 01/10/2012 1337   CO2 28 02/26/2013 1042   CO2 32 01/10/2012 1337   BUN 21.2 02/26/2013 1042   BUN 15 01/10/2012 1337   CREATININE 0.9 02/26/2013 1042   CREATININE 0.65 01/10/2012 1337      Component Value Date/Time   CALCIUM 9.4 02/26/2013 1042   CALCIUM 9.9 01/10/2012 1337   ALKPHOS 90  02/26/2013 1042   ALKPHOS 92 01/10/2012 1337   AST 19 02/26/2013 1042   AST 23 01/10/2012 1337   ALT 12 02/26/2013 1042   ALT 12 01/10/2012 1337   BILITOT 0.75 02/26/2013 1042   BILITOT 0.5 01/10/2012 1337        STUDIES: Screening mammography 11/21/2013 at [facility] was negative.  ASSESSMENT: 80 y.o. [place], [place] woman   (1)  status post left lumpectomy and sentinel lymph node biopsy in December 2006 for multifocal invasive ductal carcinoma measuring maximal 8 mm.  No lymphovascular invasion, no lymph node involvement.  Grade 1, ER positive, PR weakly positive and HER2/neu negative.    (2)  Did not tolerate Aromasin or Arimidex.  Now on observation alone given cardiac history.    PLAN:  Shavonda Is doing  fine from a breast cancer point of view, with no evidence of disease recurrence now 8-1/2 years out from her definitive surgery. We have previously discussed releasing her back to her primary care physician, but she is very clear that she would like [place] to keep and I on her "a few more years".  We discussed the fact that she is now receiving screening mammography and a bad as appropriate. We will proceed to diagnostic mammography if there is anything unusual on the screening exams.  Since she gets her mammogram late January, she is going to see me again in March of next year. She knows to call for any problems that may develop before then.  Virgie Dad Natayah Warmack    03/26/2014

## 2014-03-26 NOTE — Telephone Encounter (Signed)
per pof pt appt sch-pt will sch mamma in -gave pt copy of sch

## 2014-03-26 NOTE — Telephone Encounter (Signed)
sch mamma for pt @ [facility]-gave pt time & date

## 2014-04-15 ENCOUNTER — Other Ambulatory Visit: Payer: Self-pay | Admitting: *Deleted

## 2014-04-15 DIAGNOSIS — Z853 Personal history of malignant neoplasm of breast: Secondary | ICD-10-CM

## 2014-11-11 DIAGNOSIS — M25561 Pain in right knee: Secondary | ICD-10-CM | POA: Diagnosis not present

## 2014-11-11 DIAGNOSIS — R531 Weakness: Secondary | ICD-10-CM | POA: Diagnosis not present

## 2014-11-11 DIAGNOSIS — R600 Localized edema: Secondary | ICD-10-CM | POA: Diagnosis not present

## 2014-11-11 DIAGNOSIS — R5383 Other fatigue: Secondary | ICD-10-CM | POA: Diagnosis not present

## 2014-11-11 DIAGNOSIS — E871 Hypo-osmolality and hyponatremia: Secondary | ICD-10-CM | POA: Diagnosis not present

## 2014-11-11 DIAGNOSIS — R262 Difficulty in walking, not elsewhere classified: Secondary | ICD-10-CM | POA: Diagnosis not present

## 2014-12-12 DIAGNOSIS — C44311 Basal cell carcinoma of skin of nose: Secondary | ICD-10-CM | POA: Diagnosis not present

## 2014-12-15 DIAGNOSIS — T148 Other injury of unspecified body region: Secondary | ICD-10-CM | POA: Diagnosis not present

## 2014-12-15 DIAGNOSIS — S0093XA Contusion of unspecified part of head, initial encounter: Secondary | ICD-10-CM | POA: Diagnosis not present

## 2014-12-15 DIAGNOSIS — S199XXA Unspecified injury of neck, initial encounter: Secondary | ICD-10-CM | POA: Diagnosis not present

## 2014-12-15 DIAGNOSIS — S0101XA Laceration without foreign body of scalp, initial encounter: Secondary | ICD-10-CM | POA: Diagnosis not present

## 2014-12-15 DIAGNOSIS — S0990XA Unspecified injury of head, initial encounter: Secondary | ICD-10-CM | POA: Diagnosis not present

## 2014-12-15 DIAGNOSIS — S0191XA Laceration without foreign body of unspecified part of head, initial encounter: Secondary | ICD-10-CM | POA: Diagnosis not present

## 2014-12-15 DIAGNOSIS — I10 Essential (primary) hypertension: Secondary | ICD-10-CM | POA: Diagnosis not present

## 2014-12-17 DIAGNOSIS — G459 Transient cerebral ischemic attack, unspecified: Secondary | ICD-10-CM | POA: Diagnosis not present

## 2014-12-17 DIAGNOSIS — I10 Essential (primary) hypertension: Secondary | ICD-10-CM | POA: Diagnosis not present

## 2014-12-17 DIAGNOSIS — S0101XA Laceration without foreign body of scalp, initial encounter: Secondary | ICD-10-CM | POA: Diagnosis not present

## 2014-12-17 DIAGNOSIS — E538 Deficiency of other specified B group vitamins: Secondary | ICD-10-CM | POA: Diagnosis not present

## 2014-12-24 DIAGNOSIS — S0101XA Laceration without foreign body of scalp, initial encounter: Secondary | ICD-10-CM | POA: Diagnosis not present

## 2014-12-24 DIAGNOSIS — B349 Viral infection, unspecified: Secondary | ICD-10-CM | POA: Diagnosis not present

## 2015-01-01 DIAGNOSIS — C44311 Basal cell carcinoma of skin of nose: Secondary | ICD-10-CM | POA: Diagnosis not present

## 2015-01-03 ENCOUNTER — Other Ambulatory Visit: Payer: Self-pay | Admitting: *Deleted

## 2015-01-03 DIAGNOSIS — C50919 Malignant neoplasm of unspecified site of unspecified female breast: Secondary | ICD-10-CM

## 2015-01-06 ENCOUNTER — Other Ambulatory Visit: Payer: Commercial Managed Care - HMO

## 2015-01-06 ENCOUNTER — Telehealth: Payer: Self-pay | Admitting: Oncology

## 2015-01-06 ENCOUNTER — Ambulatory Visit: Payer: Commercial Managed Care - HMO | Admitting: Oncology

## 2015-01-06 NOTE — Telephone Encounter (Signed)
pt cld & left vm stating cant make it to appt-will call back to r/s

## 2015-01-15 DIAGNOSIS — E538 Deficiency of other specified B group vitamins: Secondary | ICD-10-CM | POA: Diagnosis not present

## 2015-01-15 DIAGNOSIS — R410 Disorientation, unspecified: Secondary | ICD-10-CM | POA: Diagnosis not present

## 2015-01-15 DIAGNOSIS — G5620 Lesion of ulnar nerve, unspecified upper limb: Secondary | ICD-10-CM | POA: Diagnosis not present

## 2015-01-15 DIAGNOSIS — S50319A Abrasion of unspecified elbow, initial encounter: Secondary | ICD-10-CM | POA: Diagnosis not present

## 2015-01-20 DIAGNOSIS — I10 Essential (primary) hypertension: Secondary | ICD-10-CM | POA: Diagnosis not present

## 2015-01-20 DIAGNOSIS — S8991XA Unspecified injury of right lower leg, initial encounter: Secondary | ICD-10-CM | POA: Diagnosis not present

## 2015-01-20 DIAGNOSIS — S199XXA Unspecified injury of neck, initial encounter: Secondary | ICD-10-CM | POA: Diagnosis not present

## 2015-01-20 DIAGNOSIS — Z79899 Other long term (current) drug therapy: Secondary | ICD-10-CM | POA: Diagnosis not present

## 2015-01-20 DIAGNOSIS — S0083XA Contusion of other part of head, initial encounter: Secondary | ICD-10-CM | POA: Diagnosis not present

## 2015-01-20 DIAGNOSIS — M7989 Other specified soft tissue disorders: Secondary | ICD-10-CM | POA: Diagnosis not present

## 2015-01-20 DIAGNOSIS — S62395A Other fracture of fourth metacarpal bone, left hand, initial encounter for closed fracture: Secondary | ICD-10-CM | POA: Diagnosis not present

## 2015-01-20 DIAGNOSIS — R51 Headache: Secondary | ICD-10-CM | POA: Diagnosis not present

## 2015-01-20 DIAGNOSIS — E780 Pure hypercholesterolemia: Secondary | ICD-10-CM | POA: Diagnosis not present

## 2015-01-20 DIAGNOSIS — S0191XA Laceration without foreign body of unspecified part of head, initial encounter: Secondary | ICD-10-CM | POA: Diagnosis not present

## 2015-01-20 DIAGNOSIS — Z8673 Personal history of transient ischemic attack (TIA), and cerebral infarction without residual deficits: Secondary | ICD-10-CM | POA: Diagnosis not present

## 2015-01-20 DIAGNOSIS — S62605A Fracture of unspecified phalanx of left ring finger, initial encounter for closed fracture: Secondary | ICD-10-CM | POA: Diagnosis not present

## 2015-01-20 DIAGNOSIS — M25561 Pain in right knee: Secondary | ICD-10-CM | POA: Diagnosis not present

## 2015-01-20 DIAGNOSIS — S59901A Unspecified injury of right elbow, initial encounter: Secondary | ICD-10-CM | POA: Diagnosis not present

## 2015-01-20 DIAGNOSIS — S80212A Abrasion, left knee, initial encounter: Secondary | ICD-10-CM | POA: Diagnosis not present

## 2015-01-20 DIAGNOSIS — S0181XA Laceration without foreign body of other part of head, initial encounter: Secondary | ICD-10-CM | POA: Diagnosis not present

## 2015-01-20 DIAGNOSIS — M25521 Pain in right elbow: Secondary | ICD-10-CM | POA: Diagnosis not present

## 2015-01-20 DIAGNOSIS — S098XXA Other specified injuries of head, initial encounter: Secondary | ICD-10-CM | POA: Diagnosis not present

## 2015-01-20 DIAGNOSIS — I440 Atrioventricular block, first degree: Secondary | ICD-10-CM | POA: Diagnosis not present

## 2015-01-23 DIAGNOSIS — S50311A Abrasion of right elbow, initial encounter: Secondary | ICD-10-CM | POA: Diagnosis not present

## 2015-01-23 DIAGNOSIS — S62305A Unspecified fracture of fourth metacarpal bone, left hand, initial encounter for closed fracture: Secondary | ICD-10-CM | POA: Diagnosis not present

## 2015-01-23 DIAGNOSIS — M24541 Contracture, right hand: Secondary | ICD-10-CM | POA: Diagnosis not present

## 2015-01-24 DIAGNOSIS — M6281 Muscle weakness (generalized): Secondary | ICD-10-CM | POA: Diagnosis not present

## 2015-01-24 DIAGNOSIS — R202 Paresthesia of skin: Secondary | ICD-10-CM | POA: Diagnosis not present

## 2015-01-27 DIAGNOSIS — M6281 Muscle weakness (generalized): Secondary | ICD-10-CM | POA: Diagnosis not present

## 2015-01-27 DIAGNOSIS — I6789 Other cerebrovascular disease: Secondary | ICD-10-CM | POA: Diagnosis not present

## 2015-01-27 DIAGNOSIS — R202 Paresthesia of skin: Secondary | ICD-10-CM | POA: Diagnosis not present

## 2015-01-28 DIAGNOSIS — I10 Essential (primary) hypertension: Secondary | ICD-10-CM | POA: Diagnosis not present

## 2015-01-28 DIAGNOSIS — S0101XA Laceration without foreign body of scalp, initial encounter: Secondary | ICD-10-CM | POA: Diagnosis not present

## 2015-01-28 DIAGNOSIS — G459 Transient cerebral ischemic attack, unspecified: Secondary | ICD-10-CM | POA: Diagnosis not present

## 2015-02-05 DIAGNOSIS — I6789 Other cerebrovascular disease: Secondary | ICD-10-CM | POA: Diagnosis not present

## 2015-02-05 DIAGNOSIS — R202 Paresthesia of skin: Secondary | ICD-10-CM | POA: Diagnosis not present

## 2015-02-05 DIAGNOSIS — M6281 Muscle weakness (generalized): Secondary | ICD-10-CM | POA: Diagnosis not present

## 2015-02-06 DIAGNOSIS — S62305A Unspecified fracture of fourth metacarpal bone, left hand, initial encounter for closed fracture: Secondary | ICD-10-CM | POA: Diagnosis not present

## 2015-02-06 DIAGNOSIS — M24541 Contracture, right hand: Secondary | ICD-10-CM | POA: Diagnosis not present

## 2015-02-10 DIAGNOSIS — R202 Paresthesia of skin: Secondary | ICD-10-CM | POA: Diagnosis not present

## 2015-02-10 DIAGNOSIS — M6281 Muscle weakness (generalized): Secondary | ICD-10-CM | POA: Diagnosis not present

## 2015-02-10 DIAGNOSIS — I6789 Other cerebrovascular disease: Secondary | ICD-10-CM | POA: Diagnosis not present

## 2015-02-12 DIAGNOSIS — I6789 Other cerebrovascular disease: Secondary | ICD-10-CM | POA: Diagnosis not present

## 2015-02-12 DIAGNOSIS — R202 Paresthesia of skin: Secondary | ICD-10-CM | POA: Diagnosis not present

## 2015-02-12 DIAGNOSIS — M6281 Muscle weakness (generalized): Secondary | ICD-10-CM | POA: Diagnosis not present

## 2015-02-21 DIAGNOSIS — H4011X2 Primary open-angle glaucoma, moderate stage: Secondary | ICD-10-CM | POA: Diagnosis not present

## 2015-02-27 DIAGNOSIS — S62305D Unspecified fracture of fourth metacarpal bone, left hand, subsequent encounter for fracture with routine healing: Secondary | ICD-10-CM | POA: Diagnosis not present

## 2015-02-27 DIAGNOSIS — L821 Other seborrheic keratosis: Secondary | ICD-10-CM | POA: Diagnosis not present

## 2015-03-03 DIAGNOSIS — R0602 Shortness of breath: Secondary | ICD-10-CM | POA: Diagnosis not present

## 2015-03-03 DIAGNOSIS — I10 Essential (primary) hypertension: Secondary | ICD-10-CM | POA: Diagnosis not present

## 2015-03-03 DIAGNOSIS — E871 Hypo-osmolality and hyponatremia: Secondary | ICD-10-CM | POA: Diagnosis not present

## 2015-03-04 DIAGNOSIS — R600 Localized edema: Secondary | ICD-10-CM | POA: Diagnosis not present

## 2015-03-04 DIAGNOSIS — R791 Abnormal coagulation profile: Secondary | ICD-10-CM | POA: Diagnosis not present

## 2015-03-04 DIAGNOSIS — R609 Edema, unspecified: Secondary | ICD-10-CM | POA: Diagnosis not present

## 2015-03-25 DIAGNOSIS — R600 Localized edema: Secondary | ICD-10-CM | POA: Diagnosis not present

## 2015-03-25 DIAGNOSIS — I10 Essential (primary) hypertension: Secondary | ICD-10-CM | POA: Diagnosis not present

## 2015-03-25 DIAGNOSIS — E538 Deficiency of other specified B group vitamins: Secondary | ICD-10-CM | POA: Diagnosis not present

## 2015-03-25 DIAGNOSIS — F329 Major depressive disorder, single episode, unspecified: Secondary | ICD-10-CM | POA: Diagnosis not present

## 2015-03-25 DIAGNOSIS — E871 Hypo-osmolality and hyponatremia: Secondary | ICD-10-CM | POA: Diagnosis not present

## 2015-03-25 DIAGNOSIS — E781 Pure hyperglyceridemia: Secondary | ICD-10-CM | POA: Diagnosis not present

## 2015-04-08 DIAGNOSIS — Z6822 Body mass index (BMI) 22.0-22.9, adult: Secondary | ICD-10-CM | POA: Diagnosis not present

## 2015-04-08 DIAGNOSIS — S41119A Laceration without foreign body of unspecified upper arm, initial encounter: Secondary | ICD-10-CM | POA: Diagnosis not present

## 2015-04-24 DIAGNOSIS — H3531 Nonexudative age-related macular degeneration: Secondary | ICD-10-CM | POA: Diagnosis not present

## 2015-05-19 DIAGNOSIS — E538 Deficiency of other specified B group vitamins: Secondary | ICD-10-CM | POA: Diagnosis not present

## 2015-05-23 DIAGNOSIS — H4011X2 Primary open-angle glaucoma, moderate stage: Secondary | ICD-10-CM | POA: Diagnosis not present

## 2015-06-20 DIAGNOSIS — R350 Frequency of micturition: Secondary | ICD-10-CM | POA: Diagnosis not present

## 2015-06-20 DIAGNOSIS — E538 Deficiency of other specified B group vitamins: Secondary | ICD-10-CM | POA: Diagnosis not present

## 2015-06-20 DIAGNOSIS — R4182 Altered mental status, unspecified: Secondary | ICD-10-CM | POA: Diagnosis not present

## 2015-06-26 DIAGNOSIS — H3531 Nonexudative age-related macular degeneration: Secondary | ICD-10-CM | POA: Diagnosis not present

## 2015-07-08 DIAGNOSIS — Z6822 Body mass index (BMI) 22.0-22.9, adult: Secondary | ICD-10-CM | POA: Diagnosis not present

## 2015-07-08 DIAGNOSIS — R300 Dysuria: Secondary | ICD-10-CM | POA: Diagnosis not present

## 2015-07-08 DIAGNOSIS — N390 Urinary tract infection, site not specified: Secondary | ICD-10-CM | POA: Diagnosis not present

## 2015-07-23 DIAGNOSIS — H3531 Nonexudative age-related macular degeneration: Secondary | ICD-10-CM | POA: Diagnosis not present

## 2015-08-05 DIAGNOSIS — L570 Actinic keratosis: Secondary | ICD-10-CM | POA: Diagnosis not present

## 2015-08-05 DIAGNOSIS — L728 Other follicular cysts of the skin and subcutaneous tissue: Secondary | ICD-10-CM | POA: Diagnosis not present

## 2015-08-05 DIAGNOSIS — L821 Other seborrheic keratosis: Secondary | ICD-10-CM | POA: Diagnosis not present

## 2015-08-20 DIAGNOSIS — R109 Unspecified abdominal pain: Secondary | ICD-10-CM | POA: Diagnosis not present

## 2015-09-11 DIAGNOSIS — E538 Deficiency of other specified B group vitamins: Secondary | ICD-10-CM | POA: Diagnosis not present

## 2015-09-29 DIAGNOSIS — Z23 Encounter for immunization: Secondary | ICD-10-CM | POA: Diagnosis not present

## 2015-10-10 DIAGNOSIS — R350 Frequency of micturition: Secondary | ICD-10-CM | POA: Diagnosis not present

## 2015-10-13 DIAGNOSIS — M25561 Pain in right knee: Secondary | ICD-10-CM | POA: Diagnosis not present

## 2015-10-13 DIAGNOSIS — R791 Abnormal coagulation profile: Secondary | ICD-10-CM | POA: Diagnosis not present

## 2015-10-13 DIAGNOSIS — E871 Hypo-osmolality and hyponatremia: Secondary | ICD-10-CM | POA: Diagnosis not present

## 2015-10-13 DIAGNOSIS — M25569 Pain in unspecified knee: Secondary | ICD-10-CM | POA: Diagnosis not present

## 2015-10-13 DIAGNOSIS — G459 Transient cerebral ischemic attack, unspecified: Secondary | ICD-10-CM | POA: Diagnosis not present

## 2015-10-13 DIAGNOSIS — M25559 Pain in unspecified hip: Secondary | ICD-10-CM | POA: Diagnosis not present

## 2015-10-13 DIAGNOSIS — N3281 Overactive bladder: Secondary | ICD-10-CM | POA: Diagnosis not present

## 2015-10-22 DIAGNOSIS — M25561 Pain in right knee: Secondary | ICD-10-CM | POA: Diagnosis not present

## 2015-10-28 DIAGNOSIS — Z6822 Body mass index (BMI) 22.0-22.9, adult: Secondary | ICD-10-CM | POA: Diagnosis not present

## 2015-10-28 DIAGNOSIS — N3281 Overactive bladder: Secondary | ICD-10-CM | POA: Diagnosis not present

## 2015-11-25 DIAGNOSIS — Z6822 Body mass index (BMI) 22.0-22.9, adult: Secondary | ICD-10-CM | POA: Diagnosis not present

## 2015-11-25 DIAGNOSIS — E538 Deficiency of other specified B group vitamins: Secondary | ICD-10-CM | POA: Diagnosis not present

## 2015-11-25 DIAGNOSIS — W19XXXA Unspecified fall, initial encounter: Secondary | ICD-10-CM | POA: Diagnosis not present

## 2015-12-09 DIAGNOSIS — J9801 Acute bronchospasm: Secondary | ICD-10-CM | POA: Diagnosis not present

## 2015-12-09 DIAGNOSIS — R413 Other amnesia: Secondary | ICD-10-CM | POA: Diagnosis not present

## 2015-12-09 DIAGNOSIS — I10 Essential (primary) hypertension: Secondary | ICD-10-CM | POA: Diagnosis not present

## 2015-12-09 DIAGNOSIS — B349 Viral infection, unspecified: Secondary | ICD-10-CM | POA: Diagnosis not present

## 2015-12-09 DIAGNOSIS — R05 Cough: Secondary | ICD-10-CM | POA: Diagnosis not present

## 2015-12-09 DIAGNOSIS — R4182 Altered mental status, unspecified: Secondary | ICD-10-CM | POA: Diagnosis not present

## 2015-12-09 DIAGNOSIS — J189 Pneumonia, unspecified organism: Secondary | ICD-10-CM | POA: Diagnosis not present

## 2015-12-09 DIAGNOSIS — E871 Hypo-osmolality and hyponatremia: Secondary | ICD-10-CM | POA: Diagnosis not present

## 2015-12-09 DIAGNOSIS — R0602 Shortness of breath: Secondary | ICD-10-CM | POA: Diagnosis not present

## 2015-12-09 DIAGNOSIS — G459 Transient cerebral ischemic attack, unspecified: Secondary | ICD-10-CM | POA: Diagnosis not present

## 2015-12-09 DIAGNOSIS — E538 Deficiency of other specified B group vitamins: Secondary | ICD-10-CM | POA: Diagnosis not present

## 2015-12-10 DIAGNOSIS — R4182 Altered mental status, unspecified: Secondary | ICD-10-CM | POA: Diagnosis not present

## 2015-12-10 DIAGNOSIS — R05 Cough: Secondary | ICD-10-CM | POA: Diagnosis not present

## 2015-12-10 DIAGNOSIS — I517 Cardiomegaly: Secondary | ICD-10-CM | POA: Diagnosis not present

## 2015-12-10 DIAGNOSIS — Z853 Personal history of malignant neoplasm of breast: Secondary | ICD-10-CM | POA: Diagnosis not present

## 2015-12-10 DIAGNOSIS — I6789 Other cerebrovascular disease: Secondary | ICD-10-CM | POA: Diagnosis not present

## 2015-12-12 DIAGNOSIS — Z6822 Body mass index (BMI) 22.0-22.9, adult: Secondary | ICD-10-CM | POA: Diagnosis not present

## 2015-12-12 DIAGNOSIS — R413 Other amnesia: Secondary | ICD-10-CM | POA: Diagnosis not present

## 2015-12-12 DIAGNOSIS — I10 Essential (primary) hypertension: Secondary | ICD-10-CM | POA: Diagnosis not present

## 2015-12-12 DIAGNOSIS — J189 Pneumonia, unspecified organism: Secondary | ICD-10-CM | POA: Diagnosis not present

## 2015-12-12 DIAGNOSIS — I509 Heart failure, unspecified: Secondary | ICD-10-CM | POA: Diagnosis not present

## 2015-12-12 DIAGNOSIS — G459 Transient cerebral ischemic attack, unspecified: Secondary | ICD-10-CM | POA: Diagnosis not present

## 2015-12-12 DIAGNOSIS — E871 Hypo-osmolality and hyponatremia: Secondary | ICD-10-CM | POA: Diagnosis not present

## 2015-12-24 DIAGNOSIS — H353132 Nonexudative age-related macular degeneration, bilateral, intermediate dry stage: Secondary | ICD-10-CM | POA: Diagnosis not present

## 2015-12-25 DIAGNOSIS — E538 Deficiency of other specified B group vitamins: Secondary | ICD-10-CM | POA: Diagnosis not present

## 2015-12-26 DIAGNOSIS — H04123 Dry eye syndrome of bilateral lacrimal glands: Secondary | ICD-10-CM | POA: Diagnosis not present

## 2016-01-02 DIAGNOSIS — Z6822 Body mass index (BMI) 22.0-22.9, adult: Secondary | ICD-10-CM | POA: Diagnosis not present

## 2016-01-02 DIAGNOSIS — H5710 Ocular pain, unspecified eye: Secondary | ICD-10-CM | POA: Diagnosis not present

## 2016-01-28 ENCOUNTER — Ambulatory Visit (INDEPENDENT_AMBULATORY_CARE_PROVIDER_SITE_OTHER): Payer: PPO | Admitting: Sports Medicine

## 2016-01-28 ENCOUNTER — Encounter: Payer: Self-pay | Admitting: Sports Medicine

## 2016-01-28 DIAGNOSIS — M2040 Other hammer toe(s) (acquired), unspecified foot: Secondary | ICD-10-CM

## 2016-01-28 DIAGNOSIS — L84 Corns and callosities: Secondary | ICD-10-CM | POA: Diagnosis not present

## 2016-01-28 DIAGNOSIS — M21619 Bunion of unspecified foot: Secondary | ICD-10-CM

## 2016-01-28 DIAGNOSIS — B351 Tinea unguium: Secondary | ICD-10-CM

## 2016-01-28 DIAGNOSIS — M79673 Pain in unspecified foot: Secondary | ICD-10-CM

## 2016-01-28 NOTE — Progress Notes (Signed)
Patient ID: SHELISE CABANILLAS, female   DOB: 06-07-1927, 82 y.o.   MRN: [id number] Subjective: BRADI ERKKILA is a 82 y.o. female patient seen today in office with complaint of painful thickened and elongated toenails; unable to trim. Patient denies history of Diabetes, Neuropathy, or known Vascular disease. Patient is on Plavix. Patient has no other pedal complaints at this time.   Patient Active Problem List   Diagnosis Date Noted  . Breast cancer ([place]) 01/10/2012    Current Outpatient Prescriptions on File Prior to Visit  Medication Sig Dispense Refill  . Candesartan Cilexetil (ATACAND PO) Take by mouth.      . Carboxymethylcellulose Sodium (THERATEARS OP) Apply to eye as needed.    Marland Kitchen Clopidogrel Bisulfate (PLAVIX PO) Take by mouth.      . diltiazem (DILACOR XR) 120 MG 24 hr capsule Take 120 mg by mouth daily. Not sure of dose    . hydrALAZINE (APRESOLINE) 10 MG tablet     . metoprolol tartrate (LOPRESSOR) 25 MG tablet      No current facility-administered medications on file prior to visit.    Allergies  Allergen Reactions  . Codeine   . Sulfa Antibiotics   . Sulfur     Objective: Physical Exam  General: Well developed, nourished, no acute distress, awake, alert and oriented x 3  Vascular: Dorsalis pedis artery 1/4 bilateral, Posterior tibial artery 1/4 bilateral, skin temperature warm to warm proximal to distal bilateral lower extremities, + varicosities, no pedal hair present bilateral.  Neurological: Gross sensation present via light touch bilateral.   Dermatological: Skin is warm, dry, and supple bilateral, Nails 1-10 are tender, long, thick, and discolored with mild subungal debris, no webspace macerations present bilateral, no open lesions present bilateral, + callus/hyperkeratotic tissue present bilateral. No signs of infection bilateral.  Musculoskeletal: Bunion and hammertoes noted bilateral. Muscular strength within normal limits without painon range of motion. No  pain with calf compression bilateral.  Assessment and Plan:  Problem List Items Addressed This Visit    None    Visit Diagnoses    Dermatophytosis of nail    -  Primary    Relevant Medications    cefUROXime (CEFTIN) 500 MG tablet    Callus of foot        Bunion        Hammer toe, unspecified laterality           -Examined patient.  -Discussed treatment options for painful callus and mycotic nails. -Patient declined mechanical debridement and trimming of calluses that were present. States that she does not want these areas trimmed only wants her nails taken care of only. -Mechanically debrided and reduced mycotic nails with sterile nail nipper and dremel nail file without incident. -Recommend good supportive shoes daily for foot type. -Patient to return in 3 months/as needed for follow up evaluation or sooner if symptoms worsen.  Landis Martins, DPM

## 2016-02-01 DIAGNOSIS — M25551 Pain in right hip: Secondary | ICD-10-CM | POA: Diagnosis not present

## 2016-02-01 DIAGNOSIS — M5431 Sciatica, right side: Secondary | ICD-10-CM | POA: Diagnosis not present

## 2016-02-01 DIAGNOSIS — S3992XA Unspecified injury of lower back, initial encounter: Secondary | ICD-10-CM | POA: Diagnosis not present

## 2016-02-01 DIAGNOSIS — M25559 Pain in unspecified hip: Secondary | ICD-10-CM | POA: Diagnosis not present

## 2016-02-01 DIAGNOSIS — M5430 Sciatica, unspecified side: Secondary | ICD-10-CM | POA: Diagnosis not present

## 2016-02-01 DIAGNOSIS — S79911A Unspecified injury of right hip, initial encounter: Secondary | ICD-10-CM | POA: Diagnosis not present

## 2016-02-01 DIAGNOSIS — R252 Cramp and spasm: Secondary | ICD-10-CM | POA: Diagnosis not present

## 2016-02-01 DIAGNOSIS — J9811 Atelectasis: Secondary | ICD-10-CM | POA: Diagnosis not present

## 2016-02-03 DIAGNOSIS — S41119A Laceration without foreign body of unspecified upper arm, initial encounter: Secondary | ICD-10-CM | POA: Diagnosis not present

## 2016-02-05 DIAGNOSIS — M5416 Radiculopathy, lumbar region: Secondary | ICD-10-CM | POA: Diagnosis not present

## 2016-02-05 DIAGNOSIS — Z6822 Body mass index (BMI) 22.0-22.9, adult: Secondary | ICD-10-CM | POA: Diagnosis not present

## 2016-02-06 DIAGNOSIS — F28 Other psychotic disorder not due to a substance or known physiological condition: Secondary | ICD-10-CM | POA: Diagnosis not present

## 2016-02-06 DIAGNOSIS — R0602 Shortness of breath: Secondary | ICD-10-CM | POA: Diagnosis not present

## 2016-02-06 DIAGNOSIS — E559 Vitamin D deficiency, unspecified: Secondary | ICD-10-CM | POA: Diagnosis not present

## 2016-02-06 DIAGNOSIS — Z7952 Long term (current) use of systemic steroids: Secondary | ICD-10-CM | POA: Diagnosis not present

## 2016-02-06 DIAGNOSIS — I509 Heart failure, unspecified: Secondary | ICD-10-CM | POA: Diagnosis not present

## 2016-02-06 DIAGNOSIS — G5620 Lesion of ulnar nerve, unspecified upper limb: Secondary | ICD-10-CM | POA: Diagnosis not present

## 2016-02-06 DIAGNOSIS — M79604 Pain in right leg: Secondary | ICD-10-CM | POA: Diagnosis not present

## 2016-02-06 DIAGNOSIS — H409 Unspecified glaucoma: Secondary | ICD-10-CM | POA: Diagnosis not present

## 2016-02-06 DIAGNOSIS — Z8673 Personal history of transient ischemic attack (TIA), and cerebral infarction without residual deficits: Secondary | ICD-10-CM | POA: Diagnosis not present

## 2016-02-06 DIAGNOSIS — E860 Dehydration: Secondary | ICD-10-CM | POA: Diagnosis not present

## 2016-02-06 DIAGNOSIS — R410 Disorientation, unspecified: Secondary | ICD-10-CM | POA: Diagnosis not present

## 2016-02-06 DIAGNOSIS — G8321 Monoplegia of upper limb affecting right dominant side: Secondary | ICD-10-CM | POA: Diagnosis not present

## 2016-02-06 DIAGNOSIS — Z7902 Long term (current) use of antithrombotics/antiplatelets: Secondary | ICD-10-CM | POA: Diagnosis not present

## 2016-02-06 DIAGNOSIS — E538 Deficiency of other specified B group vitamins: Secondary | ICD-10-CM | POA: Diagnosis not present

## 2016-02-06 DIAGNOSIS — F329 Major depressive disorder, single episode, unspecified: Secondary | ICD-10-CM | POA: Diagnosis not present

## 2016-02-06 DIAGNOSIS — R262 Difficulty in walking, not elsewhere classified: Secondary | ICD-10-CM | POA: Diagnosis not present

## 2016-02-06 DIAGNOSIS — K210 Gastro-esophageal reflux disease with esophagitis: Secondary | ICD-10-CM | POA: Diagnosis not present

## 2016-02-06 DIAGNOSIS — R300 Dysuria: Secondary | ICD-10-CM | POA: Diagnosis not present

## 2016-02-06 DIAGNOSIS — M79605 Pain in left leg: Secondary | ICD-10-CM | POA: Diagnosis not present

## 2016-02-06 DIAGNOSIS — N3281 Overactive bladder: Secondary | ICD-10-CM | POA: Diagnosis not present

## 2016-02-06 DIAGNOSIS — M79651 Pain in right thigh: Secondary | ICD-10-CM | POA: Diagnosis not present

## 2016-02-06 DIAGNOSIS — M5431 Sciatica, right side: Secondary | ICD-10-CM | POA: Diagnosis not present

## 2016-02-06 DIAGNOSIS — S79921A Unspecified injury of right thigh, initial encounter: Secondary | ICD-10-CM | POA: Diagnosis not present

## 2016-02-06 DIAGNOSIS — R600 Localized edema: Secondary | ICD-10-CM | POA: Diagnosis not present

## 2016-02-06 DIAGNOSIS — Z79899 Other long term (current) drug therapy: Secondary | ICD-10-CM | POA: Diagnosis not present

## 2016-02-06 DIAGNOSIS — Z742 Need for assistance at home and no other household member able to render care: Secondary | ICD-10-CM | POA: Diagnosis not present

## 2016-02-06 DIAGNOSIS — E785 Hyperlipidemia, unspecified: Secondary | ICD-10-CM | POA: Diagnosis not present

## 2016-02-06 DIAGNOSIS — I160 Hypertensive urgency: Secondary | ICD-10-CM | POA: Diagnosis not present

## 2016-02-06 DIAGNOSIS — I10 Essential (primary) hypertension: Secondary | ICD-10-CM | POA: Diagnosis not present

## 2016-02-06 DIAGNOSIS — F05 Delirium due to known physiological condition: Secondary | ICD-10-CM | POA: Diagnosis not present

## 2016-02-06 DIAGNOSIS — Z853 Personal history of malignant neoplasm of breast: Secondary | ICD-10-CM | POA: Diagnosis not present

## 2016-02-06 DIAGNOSIS — S50319A Abrasion of unspecified elbow, initial encounter: Secondary | ICD-10-CM | POA: Diagnosis not present

## 2016-02-06 DIAGNOSIS — E7800 Pure hypercholesterolemia, unspecified: Secondary | ICD-10-CM | POA: Diagnosis not present

## 2016-02-06 DIAGNOSIS — H3530 Unspecified macular degeneration: Secondary | ICD-10-CM | POA: Diagnosis not present

## 2016-02-06 DIAGNOSIS — R413 Other amnesia: Secondary | ICD-10-CM | POA: Diagnosis not present

## 2016-02-06 DIAGNOSIS — M25551 Pain in right hip: Secondary | ICD-10-CM | POA: Diagnosis not present

## 2016-02-06 DIAGNOSIS — F23 Brief psychotic disorder: Secondary | ICD-10-CM | POA: Diagnosis not present

## 2016-02-06 DIAGNOSIS — Z2821 Immunization not carried out because of patient refusal: Secondary | ICD-10-CM | POA: Diagnosis not present

## 2016-02-06 DIAGNOSIS — Z9181 History of falling: Secondary | ICD-10-CM | POA: Diagnosis not present

## 2016-02-06 DIAGNOSIS — R4182 Altered mental status, unspecified: Secondary | ICD-10-CM | POA: Diagnosis not present

## 2016-02-06 DIAGNOSIS — T380X5A Adverse effect of glucocorticoids and synthetic analogues, initial encounter: Secondary | ICD-10-CM | POA: Diagnosis not present

## 2016-02-10 DIAGNOSIS — R410 Disorientation, unspecified: Secondary | ICD-10-CM | POA: Diagnosis not present

## 2016-02-10 DIAGNOSIS — H353132 Nonexudative age-related macular degeneration, bilateral, intermediate dry stage: Secondary | ICD-10-CM | POA: Diagnosis not present

## 2016-02-10 DIAGNOSIS — G92 Toxic encephalopathy: Secondary | ICD-10-CM | POA: Diagnosis not present

## 2016-02-10 DIAGNOSIS — R262 Difficulty in walking, not elsewhere classified: Secondary | ICD-10-CM | POA: Diagnosis not present

## 2016-02-10 DIAGNOSIS — I119 Hypertensive heart disease without heart failure: Secondary | ICD-10-CM | POA: Diagnosis not present

## 2016-02-10 DIAGNOSIS — I10 Essential (primary) hypertension: Secondary | ICD-10-CM | POA: Diagnosis not present

## 2016-02-10 DIAGNOSIS — M5430 Sciatica, unspecified side: Secondary | ICD-10-CM | POA: Diagnosis not present

## 2016-02-10 DIAGNOSIS — M439 Deforming dorsopathy, unspecified: Secondary | ICD-10-CM | POA: Diagnosis not present

## 2016-02-10 DIAGNOSIS — M79604 Pain in right leg: Secondary | ICD-10-CM | POA: Diagnosis not present

## 2016-02-17 DIAGNOSIS — R262 Difficulty in walking, not elsewhere classified: Secondary | ICD-10-CM | POA: Diagnosis not present

## 2016-02-17 DIAGNOSIS — G92 Toxic encephalopathy: Secondary | ICD-10-CM | POA: Diagnosis not present

## 2016-02-17 DIAGNOSIS — I119 Hypertensive heart disease without heart failure: Secondary | ICD-10-CM | POA: Diagnosis not present

## 2016-02-17 DIAGNOSIS — M439 Deforming dorsopathy, unspecified: Secondary | ICD-10-CM | POA: Diagnosis not present

## 2016-02-18 DIAGNOSIS — H353132 Nonexudative age-related macular degeneration, bilateral, intermediate dry stage: Secondary | ICD-10-CM | POA: Diagnosis not present

## 2016-02-27 DIAGNOSIS — G459 Transient cerebral ischemic attack, unspecified: Secondary | ICD-10-CM | POA: Diagnosis not present

## 2016-03-08 DIAGNOSIS — I10 Essential (primary) hypertension: Secondary | ICD-10-CM | POA: Diagnosis not present

## 2016-03-08 DIAGNOSIS — M79604 Pain in right leg: Secondary | ICD-10-CM | POA: Diagnosis not present

## 2016-03-08 DIAGNOSIS — G459 Transient cerebral ischemic attack, unspecified: Secondary | ICD-10-CM | POA: Diagnosis not present

## 2016-03-08 DIAGNOSIS — Z6822 Body mass index (BMI) 22.0-22.9, adult: Secondary | ICD-10-CM | POA: Diagnosis not present

## 2016-04-08 DIAGNOSIS — E538 Deficiency of other specified B group vitamins: Secondary | ICD-10-CM | POA: Diagnosis not present

## 2016-04-08 DIAGNOSIS — G459 Transient cerebral ischemic attack, unspecified: Secondary | ICD-10-CM | POA: Diagnosis not present

## 2016-04-08 DIAGNOSIS — Z6822 Body mass index (BMI) 22.0-22.9, adult: Secondary | ICD-10-CM | POA: Diagnosis not present

## 2016-04-08 DIAGNOSIS — H409 Unspecified glaucoma: Secondary | ICD-10-CM | POA: Diagnosis not present

## 2016-04-08 DIAGNOSIS — I10 Essential (primary) hypertension: Secondary | ICD-10-CM | POA: Diagnosis not present

## 2016-04-14 DIAGNOSIS — S81819A Laceration without foreign body, unspecified lower leg, initial encounter: Secondary | ICD-10-CM | POA: Diagnosis not present

## 2016-04-14 DIAGNOSIS — S0091XA Abrasion of unspecified part of head, initial encounter: Secondary | ICD-10-CM | POA: Diagnosis not present

## 2016-04-14 DIAGNOSIS — S0003XA Contusion of scalp, initial encounter: Secondary | ICD-10-CM | POA: Diagnosis not present

## 2016-04-14 DIAGNOSIS — T148 Other injury of unspecified body region: Secondary | ICD-10-CM | POA: Diagnosis not present

## 2016-04-14 DIAGNOSIS — I6200 Nontraumatic subdural hemorrhage, unspecified: Secondary | ICD-10-CM | POA: Diagnosis not present

## 2016-04-14 DIAGNOSIS — Y9389 Activity, other specified: Secondary | ICD-10-CM | POA: Diagnosis not present

## 2016-04-14 DIAGNOSIS — R51 Headache: Secondary | ICD-10-CM | POA: Diagnosis not present

## 2016-04-14 DIAGNOSIS — W19XXXA Unspecified fall, initial encounter: Secondary | ICD-10-CM | POA: Diagnosis not present

## 2016-05-05 ENCOUNTER — Ambulatory Visit (INDEPENDENT_AMBULATORY_CARE_PROVIDER_SITE_OTHER): Payer: PPO | Admitting: Sports Medicine

## 2016-05-05 ENCOUNTER — Encounter: Payer: Self-pay | Admitting: Sports Medicine

## 2016-05-05 DIAGNOSIS — L84 Corns and callosities: Secondary | ICD-10-CM

## 2016-05-05 DIAGNOSIS — B351 Tinea unguium: Secondary | ICD-10-CM

## 2016-05-05 DIAGNOSIS — M2040 Other hammer toe(s) (acquired), unspecified foot: Secondary | ICD-10-CM

## 2016-05-05 DIAGNOSIS — M79676 Pain in unspecified toe(s): Secondary | ICD-10-CM

## 2016-05-05 DIAGNOSIS — M21619 Bunion of unspecified foot: Secondary | ICD-10-CM

## 2016-05-05 NOTE — Progress Notes (Signed)
Patient ID: Hailey Werner, female   DOB: [birth year], 82 y.o.   MRN: [id number]  Subjective: Hailey Werner is a 82 y.o. female patient seen today in office with complaint of painful thickened and elongated toenails and callus; unable to trim. Patient is assisted by daughter who states that mother is [place] with her husband due to husband having a trip and fall and breaking hip and is there for rehabilitation. Patient denies any changes with her medical history. Patient is on Plavix. Patient has no other pedal complaints at this time.   Patient Active Problem List   Diagnosis Date Noted  . Breast cancer ([place]) 01/10/2012    Current Outpatient Prescriptions on File Prior to Visit  Medication Sig Dispense Refill  . bimatoprost (LUMIGAN) 0.01 % SOLN     . Calcium Carb-Cholecalciferol (CALCIUM CARBONATE-VITAMIN D3 PO) Take by mouth.    . Candesartan Cilexetil (ATACAND PO) Take by mouth.      . Carboxymethylcellulose Sodium (THERATEARS OP) Apply to eye as needed.    . cefUROXime (CEFTIN) 500 MG tablet Take 500 mg by mouth 2 (two) times daily.  0  . cloNIDine (CATAPRES) 0.1 MG tablet TAKE 1 TABLET EVERY 4-6 HOURS AS NEEDED.  12  . Clopidogrel Bisulfate (PLAVIX PO) Take by mouth.      . diltiazem (CARDIZEM CD) 240 MG 24 hr capsule TAKE 1 CAPSULE BY MOUTH ONCE DAILY.  2  . diltiazem (DILACOR XR) 120 MG 24 hr capsule Take 120 mg by mouth daily. Not sure of dose    . hydrALAZINE (APRESOLINE) 10 MG tablet     . hydrALAZINE (APRESOLINE) 25 MG tablet TAKE 1 TABLET THREE TIMES DAILY.  2  . LUMIGAN 0.01 % SOLN See admin instructions.  3  . metoprolol tartrate (LOPRESSOR) 25 MG tablet     . oxybutynin (DITROPAN) 5 MG tablet      No current facility-administered medications on file prior to visit.    Allergies  Allergen Reactions  . Codeine   . Sulfa Antibiotics   . Sulfur     Objective: Physical Exam  General: Well developed, nourished, no acute distress, awake, alert  and oriented x 3  Vascular: Dorsalis pedis artery 1/4 bilateral, Posterior tibial artery 1/4 bilateral, skin temperature warm to warm proximal to distal bilateral lower extremities, + varicosities, no pedal hair present bilateral.  Neurological: Gross sensation present via light touch bilateral.   Dermatological: Skin is warm, dry, and supple bilateral, Nails 1-10 are tender, long, thick, and discolored with mild subungal debris, no webspace macerations present bilateral, no open lesions present bilateral, + callus/hyperkeratotic tissue present sub-met 2 and 4 on right, sub-met 4 on left, and right fourth toe. No signs of infection bilateral.  Musculoskeletal: Bunion and hammertoes noted bilateral. Muscular strength within normal limits without painon range of motion. No pain with calf compression bilateral.  Assessment and Plan:  Problem List Items Addressed This Visit    None    Visit Diagnoses    Dermatophytosis of nail    -  Primary    Callus of foot        Bunion        Hammer toe, unspecified laterality           -Examined patient.  -Discussed treatment options for painful callus and mycotic nails. -Mechanically debrided Callus bilateral using sterile chisel blade to patient's tolerance and reduced mycotic nails with sterile nail nipper and dremel nail file without incident. -  Recommend good supportive shoes daily for foot type. -Patient to return in 3 months/as needed for follow up evaluation or sooner if symptoms worsen.  Landis Martins, DPM

## 2016-05-07 DIAGNOSIS — C50919 Malignant neoplasm of unspecified site of unspecified female breast: Secondary | ICD-10-CM | POA: Diagnosis not present

## 2016-05-07 DIAGNOSIS — Z7689 Persons encountering health services in other specified circumstances: Secondary | ICD-10-CM | POA: Diagnosis not present

## 2016-05-07 DIAGNOSIS — Z9181 History of falling: Secondary | ICD-10-CM | POA: Diagnosis not present

## 2016-05-07 DIAGNOSIS — E559 Vitamin D deficiency, unspecified: Secondary | ICD-10-CM | POA: Diagnosis not present

## 2016-05-07 DIAGNOSIS — H409 Unspecified glaucoma: Secondary | ICD-10-CM | POA: Diagnosis not present

## 2016-05-07 DIAGNOSIS — I10 Essential (primary) hypertension: Secondary | ICD-10-CM | POA: Diagnosis not present

## 2016-05-07 DIAGNOSIS — G459 Transient cerebral ischemic attack, unspecified: Secondary | ICD-10-CM | POA: Diagnosis not present

## 2016-05-27 DIAGNOSIS — N390 Urinary tract infection, site not specified: Secondary | ICD-10-CM | POA: Diagnosis not present

## 2016-06-22 DIAGNOSIS — R233 Spontaneous ecchymoses: Secondary | ICD-10-CM | POA: Diagnosis not present

## 2016-06-22 DIAGNOSIS — L821 Other seborrheic keratosis: Secondary | ICD-10-CM | POA: Diagnosis not present

## 2016-06-22 DIAGNOSIS — L728 Other follicular cysts of the skin and subcutaneous tissue: Secondary | ICD-10-CM | POA: Diagnosis not present

## 2016-06-25 DIAGNOSIS — Z9181 History of falling: Secondary | ICD-10-CM | POA: Diagnosis not present

## 2016-06-25 DIAGNOSIS — G459 Transient cerebral ischemic attack, unspecified: Secondary | ICD-10-CM | POA: Diagnosis not present

## 2016-06-25 DIAGNOSIS — I10 Essential (primary) hypertension: Secondary | ICD-10-CM | POA: Diagnosis not present

## 2016-07-02 DIAGNOSIS — C50919 Malignant neoplasm of unspecified site of unspecified female breast: Secondary | ICD-10-CM | POA: Diagnosis not present

## 2016-07-02 DIAGNOSIS — E559 Vitamin D deficiency, unspecified: Secondary | ICD-10-CM | POA: Diagnosis not present

## 2016-07-02 DIAGNOSIS — R413 Other amnesia: Secondary | ICD-10-CM | POA: Diagnosis not present

## 2016-07-02 DIAGNOSIS — Z6822 Body mass index (BMI) 22.0-22.9, adult: Secondary | ICD-10-CM | POA: Diagnosis not present

## 2016-07-02 DIAGNOSIS — R451 Restlessness and agitation: Secondary | ICD-10-CM | POA: Diagnosis not present

## 2016-07-02 DIAGNOSIS — Z79899 Other long term (current) drug therapy: Secondary | ICD-10-CM | POA: Diagnosis not present

## 2016-07-02 DIAGNOSIS — F22 Delusional disorders: Secondary | ICD-10-CM | POA: Diagnosis not present

## 2016-07-02 DIAGNOSIS — I10 Essential (primary) hypertension: Secondary | ICD-10-CM | POA: Diagnosis not present

## 2016-07-26 DIAGNOSIS — H9193 Unspecified hearing loss, bilateral: Secondary | ICD-10-CM | POA: Diagnosis not present

## 2016-07-26 DIAGNOSIS — H3530 Unspecified macular degeneration: Secondary | ICD-10-CM | POA: Diagnosis not present

## 2016-07-26 DIAGNOSIS — I10 Essential (primary) hypertension: Secondary | ICD-10-CM | POA: Diagnosis not present

## 2016-07-26 DIAGNOSIS — R413 Other amnesia: Secondary | ICD-10-CM | POA: Diagnosis not present

## 2016-07-26 DIAGNOSIS — Z6833 Body mass index (BMI) 33.0-33.9, adult: Secondary | ICD-10-CM | POA: Diagnosis not present

## 2016-07-26 DIAGNOSIS — Z23 Encounter for immunization: Secondary | ICD-10-CM | POA: Diagnosis not present

## 2016-07-30 DIAGNOSIS — H918X1 Other specified hearing loss, right ear: Secondary | ICD-10-CM | POA: Diagnosis not present

## 2016-07-30 DIAGNOSIS — I10 Essential (primary) hypertension: Secondary | ICD-10-CM | POA: Diagnosis not present

## 2016-07-30 DIAGNOSIS — H903 Sensorineural hearing loss, bilateral: Secondary | ICD-10-CM | POA: Diagnosis not present

## 2016-08-05 ENCOUNTER — Ambulatory Visit (INDEPENDENT_AMBULATORY_CARE_PROVIDER_SITE_OTHER): Payer: PPO | Admitting: Sports Medicine

## 2016-08-05 ENCOUNTER — Encounter: Payer: Self-pay | Admitting: Sports Medicine

## 2016-08-05 DIAGNOSIS — M2040 Other hammer toe(s) (acquired), unspecified foot: Secondary | ICD-10-CM

## 2016-08-05 DIAGNOSIS — L84 Corns and callosities: Secondary | ICD-10-CM

## 2016-08-05 DIAGNOSIS — Q828 Other specified congenital malformations of skin: Secondary | ICD-10-CM | POA: Diagnosis not present

## 2016-08-05 DIAGNOSIS — M79676 Pain in unspecified toe(s): Secondary | ICD-10-CM

## 2016-08-05 DIAGNOSIS — I739 Peripheral vascular disease, unspecified: Secondary | ICD-10-CM

## 2016-08-05 DIAGNOSIS — M21619 Bunion of unspecified foot: Secondary | ICD-10-CM

## 2016-08-05 DIAGNOSIS — B351 Tinea unguium: Secondary | ICD-10-CM

## 2016-08-05 NOTE — Progress Notes (Signed)
Patient ID: Hailey Werner, female   DOB: 02-Dec-1926, 82 y.o.   MRN: [id number]  Subjective: Hailey Werner is a 82 y.o. female patient seen today in office with complaint of painful thickened and elongated toenails and callus; unable to trim. Patient is assisted by friend/caregiver. Patient denies any changes with her medical history. Patient is on Plavix. Patient has no other pedal complaints at this time.   Patient Active Problem List   Diagnosis Date Noted  . Breast cancer ([facility]) 01/10/2012    Current Outpatient Prescriptions on File Prior to Visit  Medication Sig Dispense Refill  . bimatoprost (LUMIGAN) 0.01 % SOLN     . Calcium Carb-Cholecalciferol (CALCIUM CARBONATE-VITAMIN D3 PO) Take by mouth.    . Candesartan Cilexetil (ATACAND PO) Take by mouth.      . Carboxymethylcellulose Sodium (THERATEARS OP) Apply to eye as needed.    . cefUROXime (CEFTIN) 500 MG tablet Take 500 mg by mouth 2 (two) times daily.  0  . cloNIDine (CATAPRES) 0.1 MG tablet TAKE 1 TABLET EVERY 4-6 HOURS AS NEEDED.  12  . Clopidogrel Bisulfate (PLAVIX PO) Take by mouth.      . diltiazem (CARDIZEM CD) 240 MG 24 hr capsule TAKE 1 CAPSULE BY MOUTH ONCE DAILY.  2  . diltiazem (DILACOR XR) 120 MG 24 hr capsule Take 120 mg by mouth daily. Not sure of dose    . hydrALAZINE (APRESOLINE) 10 MG tablet     . hydrALAZINE (APRESOLINE) 25 MG tablet TAKE 1 TABLET THREE TIMES DAILY.  2  . LUMIGAN 0.01 % SOLN See admin instructions.  3  . metoprolol tartrate (LOPRESSOR) 25 MG tablet     . oxybutynin (DITROPAN) 5 MG tablet      No current facility-administered medications on file prior to visit.     Allergies  Allergen Reactions  . Codeine   . Sulfa Antibiotics   . Sulfur     Objective: Physical Exam  General: Well developed, nourished, no acute distress, awake, alert and oriented x 3  Vascular: Dorsalis pedis artery 1/4 bilateral, Posterior tibial artery 1/4 bilateral, skin temperature warm to warm proximal to  distal bilateral lower extremities, + varicosities, 1+ pitting edema bilateral ankles, no pedal hair present bilateral.  Neurological: Gross sensation present via light touch bilateral.   Dermatological: Skin is warm, dry, and supple bilateral, Nails 1-10 are tender, long, thick, and discolored with mild subungal debris, no webspace macerations present bilateral, no open lesions present bilateral, + callus/hyperkeratotic tissue present sub-met 2 and 4 on right, sub-met 4 on left, and right fourth toe. No signs of infection bilateral.  Musculoskeletal: Bunion and hammertoes noted bilateral. Muscular strength within normal limits without painon range of motion. No pain with calf compression bilateral.  Assessment and Plan:  Problem List Items Addressed This Visit    None    Visit Diagnoses    Dermatophytosis of nail    -  Primary   Callus of foot       Bunion       Hammer toe, unspecified laterality       PVD (peripheral vascular disease) ([facility])          -Examined patient.  -Discussed treatment options for painful callus and mycotic nails. -Mechanically debrided Callus bilateral using sterile chisel blade to patient's tolerance and reduced mycotic nails with sterile nail nipper and dremel nail file without incident. -Recommend good supportive shoes daily for foot type. -Patient to return in 2.5 months for follow  up evaluation or sooner if symptoms worsen.  Landis Martins, DPM

## 2016-08-11 DIAGNOSIS — E785 Hyperlipidemia, unspecified: Secondary | ICD-10-CM | POA: Diagnosis not present

## 2016-08-11 DIAGNOSIS — I10 Essential (primary) hypertension: Secondary | ICD-10-CM | POA: Diagnosis not present

## 2016-08-11 DIAGNOSIS — E538 Deficiency of other specified B group vitamins: Secondary | ICD-10-CM | POA: Diagnosis not present

## 2016-08-11 DIAGNOSIS — R413 Other amnesia: Secondary | ICD-10-CM | POA: Diagnosis not present

## 2016-08-11 DIAGNOSIS — M1610 Unilateral primary osteoarthritis, unspecified hip: Secondary | ICD-10-CM | POA: Diagnosis not present

## 2016-09-01 DIAGNOSIS — F0391 Unspecified dementia with behavioral disturbance: Secondary | ICD-10-CM | POA: Diagnosis not present

## 2016-09-07 DIAGNOSIS — I10 Essential (primary) hypertension: Secondary | ICD-10-CM | POA: Diagnosis not present

## 2016-09-07 DIAGNOSIS — F22 Delusional disorders: Secondary | ICD-10-CM | POA: Diagnosis not present

## 2016-09-07 DIAGNOSIS — R413 Other amnesia: Secondary | ICD-10-CM | POA: Diagnosis not present

## 2016-09-07 DIAGNOSIS — G459 Transient cerebral ischemic attack, unspecified: Secondary | ICD-10-CM | POA: Diagnosis not present

## 2016-09-07 DIAGNOSIS — R451 Restlessness and agitation: Secondary | ICD-10-CM | POA: Diagnosis not present

## 2016-09-07 DIAGNOSIS — K210 Gastro-esophageal reflux disease with esophagitis: Secondary | ICD-10-CM | POA: Diagnosis not present

## 2016-09-13 DIAGNOSIS — I10 Essential (primary) hypertension: Secondary | ICD-10-CM | POA: Diagnosis not present

## 2016-09-13 DIAGNOSIS — S61419A Laceration without foreign body of unspecified hand, initial encounter: Secondary | ICD-10-CM | POA: Diagnosis not present

## 2016-09-13 DIAGNOSIS — R451 Restlessness and agitation: Secondary | ICD-10-CM | POA: Diagnosis not present

## 2016-09-13 DIAGNOSIS — F22 Delusional disorders: Secondary | ICD-10-CM | POA: Diagnosis not present

## 2016-09-13 DIAGNOSIS — S20411A Abrasion of right back wall of thorax, initial encounter: Secondary | ICD-10-CM | POA: Diagnosis not present

## 2016-09-13 DIAGNOSIS — R413 Other amnesia: Secondary | ICD-10-CM | POA: Diagnosis not present

## 2016-09-13 DIAGNOSIS — K210 Gastro-esophageal reflux disease with esophagitis: Secondary | ICD-10-CM | POA: Diagnosis not present

## 2016-09-13 DIAGNOSIS — G459 Transient cerebral ischemic attack, unspecified: Secondary | ICD-10-CM | POA: Diagnosis not present

## 2016-09-13 DIAGNOSIS — S61419S Laceration without foreign body of unspecified hand, sequela: Secondary | ICD-10-CM | POA: Diagnosis not present

## 2016-10-13 DIAGNOSIS — Z8673 Personal history of transient ischemic attack (TIA), and cerebral infarction without residual deficits: Secondary | ICD-10-CM | POA: Diagnosis not present

## 2016-10-13 DIAGNOSIS — G479 Sleep disorder, unspecified: Secondary | ICD-10-CM | POA: Diagnosis not present

## 2016-10-13 DIAGNOSIS — F22 Delusional disorders: Secondary | ICD-10-CM | POA: Diagnosis not present

## 2016-10-13 DIAGNOSIS — Z882 Allergy status to sulfonamides status: Secondary | ICD-10-CM | POA: Diagnosis not present

## 2016-10-13 DIAGNOSIS — Z9049 Acquired absence of other specified parts of digestive tract: Secondary | ICD-10-CM | POA: Diagnosis not present

## 2016-10-13 DIAGNOSIS — R609 Edema, unspecified: Secondary | ICD-10-CM | POA: Diagnosis not present

## 2016-10-13 DIAGNOSIS — H9190 Unspecified hearing loss, unspecified ear: Secondary | ICD-10-CM | POA: Diagnosis not present

## 2016-10-13 DIAGNOSIS — R112 Nausea with vomiting, unspecified: Secondary | ICD-10-CM | POA: Diagnosis not present

## 2016-10-13 DIAGNOSIS — H9193 Unspecified hearing loss, bilateral: Secondary | ICD-10-CM | POA: Diagnosis not present

## 2016-10-13 DIAGNOSIS — F603 Borderline personality disorder: Secondary | ICD-10-CM | POA: Diagnosis not present

## 2016-10-13 DIAGNOSIS — F0391 Unspecified dementia with behavioral disturbance: Secondary | ICD-10-CM | POA: Diagnosis not present

## 2016-10-13 DIAGNOSIS — I10 Essential (primary) hypertension: Secondary | ICD-10-CM | POA: Diagnosis not present

## 2016-10-13 DIAGNOSIS — Z885 Allergy status to narcotic agent status: Secondary | ICD-10-CM | POA: Diagnosis not present

## 2016-10-13 DIAGNOSIS — R1310 Dysphagia, unspecified: Secondary | ICD-10-CM | POA: Diagnosis not present

## 2016-10-13 DIAGNOSIS — H3530 Unspecified macular degeneration: Secondary | ICD-10-CM | POA: Diagnosis not present

## 2016-10-13 DIAGNOSIS — F419 Anxiety disorder, unspecified: Secondary | ICD-10-CM | POA: Diagnosis not present

## 2016-10-13 DIAGNOSIS — Z90710 Acquired absence of both cervix and uterus: Secondary | ICD-10-CM | POA: Diagnosis not present

## 2016-10-13 DIAGNOSIS — N3946 Mixed incontinence: Secondary | ICD-10-CM | POA: Diagnosis not present

## 2016-10-13 DIAGNOSIS — Z8249 Family history of ischemic heart disease and other diseases of the circulatory system: Secondary | ICD-10-CM | POA: Diagnosis not present

## 2016-10-13 DIAGNOSIS — Z9849 Cataract extraction status, unspecified eye: Secondary | ICD-10-CM | POA: Diagnosis not present

## 2016-10-13 DIAGNOSIS — R296 Repeated falls: Secondary | ICD-10-CM | POA: Diagnosis not present

## 2016-10-14 ENCOUNTER — Ambulatory Visit (INDEPENDENT_AMBULATORY_CARE_PROVIDER_SITE_OTHER): Payer: PPO | Admitting: Sports Medicine

## 2016-10-14 ENCOUNTER — Encounter: Payer: Self-pay | Admitting: Sports Medicine

## 2016-10-14 DIAGNOSIS — I739 Peripheral vascular disease, unspecified: Secondary | ICD-10-CM

## 2016-10-14 DIAGNOSIS — L84 Corns and callosities: Secondary | ICD-10-CM

## 2016-10-14 DIAGNOSIS — B351 Tinea unguium: Secondary | ICD-10-CM | POA: Diagnosis not present

## 2016-10-14 DIAGNOSIS — Q828 Other specified congenital malformations of skin: Secondary | ICD-10-CM

## 2016-10-14 DIAGNOSIS — M2040 Other hammer toe(s) (acquired), unspecified foot: Secondary | ICD-10-CM

## 2016-10-14 DIAGNOSIS — M21619 Bunion of unspecified foot: Secondary | ICD-10-CM

## 2016-10-14 NOTE — Progress Notes (Signed)
Patient ID: Hailey Werner, female   DOB: 1927/06/30, 82 y.o.   MRN: [id number]  Subjective: Hailey Werner is a 82 y.o. female patient seen today in office with complaint of painful thickened and elongated toenails and callus; unable to trim. Patient is assisted by friend/caregiver. Patient denies any changes with her medical history. Patient is on Plavix and was also started on another medication that starts with a "C" but can not recall the name. Patient has no other pedal complaints at this time.   Patient reports that she wants me to only trim her nails.  Patient Active Problem List   Diagnosis Date Noted  . Breast cancer ([place]) 01/10/2012    Current Outpatient Prescriptions on File Prior to Visit  Medication Sig Dispense Refill  . bimatoprost (LUMIGAN) 0.01 % SOLN     . Calcium Carb-Cholecalciferol (CALCIUM CARBONATE-VITAMIN D3 PO) Take by mouth.    . Candesartan Cilexetil (ATACAND PO) Take by mouth.      . Carboxymethylcellulose Sodium (THERATEARS OP) Apply to eye as needed.    . cefUROXime (CEFTIN) 500 MG tablet Take 500 mg by mouth 2 (two) times daily.  0  . cloNIDine (CATAPRES) 0.1 MG tablet TAKE 1 TABLET EVERY 4-6 HOURS AS NEEDED.  12  . Clopidogrel Bisulfate (PLAVIX PO) Take by mouth.      . diltiazem (CARDIZEM CD) 240 MG 24 hr capsule TAKE 1 CAPSULE BY MOUTH ONCE DAILY.  2  . diltiazem (DILACOR XR) 120 MG 24 hr capsule Take 120 mg by mouth daily. Not sure of dose    . hydrALAZINE (APRESOLINE) 10 MG tablet     . hydrALAZINE (APRESOLINE) 25 MG tablet TAKE 1 TABLET THREE TIMES DAILY.  2  . LUMIGAN 0.01 % SOLN See admin instructions.  3  . metoprolol tartrate (LOPRESSOR) 25 MG tablet     . oxybutynin (DITROPAN) 5 MG tablet      No current facility-administered medications on file prior to visit.     Allergies  Allergen Reactions  . Fluconazole Hives  . Codeine   . Losartan Potassium     Difficulty speaking  . Prednisone     psychosis  . Sulfa Antibiotics   .  Sulfasalazine   . Sulfur     Objective: Physical Exam  General: Well developed, nourished, no acute distress, awake, alert and oriented x 3  Vascular: Dorsalis pedis artery 1/4 bilateral, Posterior tibial artery 1/4 bilateral, skin temperature warm to warm proximal to distal bilateral lower extremities, + varicosities, 1+ pitting edema bilateral ankles, no pedal hair present bilateral.  Neurological: Gross sensation present via light touch bilateral.   Dermatological: Skin is warm, dry, and supple bilateral, Nails 1-10 are tender, long, thick, and discolored with mild subungal debris, no webspace macerations present bilateral, no open lesions present bilateral, + callus/hyperkeratotic tissue present sub-met 2 and 4 on right, sub-met 4 on left, and right fourth toe. No signs of infection bilateral.  Musculoskeletal: Bunion and hammertoes noted bilateral. Muscular strength within normal limits without painon range of motion. No pain with calf compression bilateral.  Assessment and Plan:  Problem List Items Addressed This Visit    None    Visit Diagnoses    Dermatophytosis of nail    -  Primary   Callus of foot       Bunion       Hammer toe, unspecified laterality       PVD (peripheral vascular disease) ([place])          -  Examined patient.  -Discussed treatment options for painful callus and mycotic nails. -Mechanically debrided Callus bilateral using sterile chisel blade to patient's tolerance and reduced mycotic nails with sterile nail nipper and dremel nail file without incident. -Recommend good supportive shoes daily for foot type. -Patient to return in 2.5 months for follow up evaluation or sooner if symptoms worsen.  Landis Martins, DPM

## 2016-11-19 DIAGNOSIS — K5900 Constipation, unspecified: Secondary | ICD-10-CM | POA: Diagnosis not present

## 2016-11-19 DIAGNOSIS — R918 Other nonspecific abnormal finding of lung field: Secondary | ICD-10-CM | POA: Diagnosis not present

## 2016-11-19 DIAGNOSIS — Z8673 Personal history of transient ischemic attack (TIA), and cerebral infarction without residual deficits: Secondary | ICD-10-CM | POA: Diagnosis not present

## 2016-11-19 DIAGNOSIS — F329 Major depressive disorder, single episode, unspecified: Secondary | ICD-10-CM | POA: Diagnosis not present

## 2016-11-19 DIAGNOSIS — R000 Tachycardia, unspecified: Secondary | ICD-10-CM | POA: Diagnosis not present

## 2016-11-19 DIAGNOSIS — Z9049 Acquired absence of other specified parts of digestive tract: Secondary | ICD-10-CM | POA: Diagnosis not present

## 2016-11-19 DIAGNOSIS — R079 Chest pain, unspecified: Secondary | ICD-10-CM | POA: Diagnosis not present

## 2016-11-19 DIAGNOSIS — Z90710 Acquired absence of both cervix and uterus: Secondary | ICD-10-CM | POA: Diagnosis not present

## 2016-11-19 DIAGNOSIS — I509 Heart failure, unspecified: Secondary | ICD-10-CM | POA: Diagnosis not present

## 2016-11-19 DIAGNOSIS — F22 Delusional disorders: Secondary | ICD-10-CM | POA: Diagnosis not present

## 2016-11-19 DIAGNOSIS — M6281 Muscle weakness (generalized): Secondary | ICD-10-CM | POA: Diagnosis not present

## 2016-11-19 DIAGNOSIS — H9190 Unspecified hearing loss, unspecified ear: Secondary | ICD-10-CM | POA: Diagnosis not present

## 2016-11-19 DIAGNOSIS — Z853 Personal history of malignant neoplasm of breast: Secondary | ICD-10-CM | POA: Diagnosis not present

## 2016-11-19 DIAGNOSIS — F0391 Unspecified dementia with behavioral disturbance: Secondary | ICD-10-CM | POA: Diagnosis not present

## 2016-11-19 DIAGNOSIS — Z9849 Cataract extraction status, unspecified eye: Secondary | ICD-10-CM | POA: Diagnosis not present

## 2016-11-19 DIAGNOSIS — H409 Unspecified glaucoma: Secondary | ICD-10-CM | POA: Diagnosis not present

## 2016-11-19 DIAGNOSIS — F419 Anxiety disorder, unspecified: Secondary | ICD-10-CM | POA: Diagnosis not present

## 2016-11-19 DIAGNOSIS — H3530 Unspecified macular degeneration: Secondary | ICD-10-CM | POA: Diagnosis not present

## 2016-11-19 DIAGNOSIS — R4182 Altered mental status, unspecified: Secondary | ICD-10-CM | POA: Diagnosis not present

## 2016-11-19 DIAGNOSIS — I110 Hypertensive heart disease with heart failure: Secondary | ICD-10-CM | POA: Diagnosis not present

## 2016-11-19 DIAGNOSIS — I4891 Unspecified atrial fibrillation: Secondary | ICD-10-CM | POA: Diagnosis not present

## 2016-11-20 DIAGNOSIS — F0391 Unspecified dementia with behavioral disturbance: Secondary | ICD-10-CM | POA: Diagnosis not present

## 2016-11-20 DIAGNOSIS — F22 Delusional disorders: Secondary | ICD-10-CM | POA: Diagnosis not present

## 2016-11-21 DIAGNOSIS — F22 Delusional disorders: Secondary | ICD-10-CM | POA: Diagnosis not present

## 2016-11-21 DIAGNOSIS — F0391 Unspecified dementia with behavioral disturbance: Secondary | ICD-10-CM | POA: Diagnosis not present

## 2016-11-22 DIAGNOSIS — R451 Restlessness and agitation: Secondary | ICD-10-CM | POA: Diagnosis not present

## 2016-11-22 DIAGNOSIS — G300 Alzheimer's disease with early onset: Secondary | ICD-10-CM | POA: Diagnosis not present

## 2016-11-22 DIAGNOSIS — F0281 Dementia in other diseases classified elsewhere with behavioral disturbance: Secondary | ICD-10-CM | POA: Diagnosis not present

## 2016-11-23 DIAGNOSIS — F419 Anxiety disorder, unspecified: Secondary | ICD-10-CM | POA: Diagnosis not present

## 2016-11-23 DIAGNOSIS — F22 Delusional disorders: Secondary | ICD-10-CM | POA: Diagnosis not present

## 2016-11-23 DIAGNOSIS — I10 Essential (primary) hypertension: Secondary | ICD-10-CM | POA: Diagnosis not present

## 2016-11-23 DIAGNOSIS — F0391 Unspecified dementia with behavioral disturbance: Secondary | ICD-10-CM | POA: Diagnosis not present

## 2016-11-24 DIAGNOSIS — F22 Delusional disorders: Secondary | ICD-10-CM | POA: Diagnosis not present

## 2016-11-24 DIAGNOSIS — F0281 Dementia in other diseases classified elsewhere with behavioral disturbance: Secondary | ICD-10-CM | POA: Diagnosis not present

## 2016-11-25 DIAGNOSIS — F22 Delusional disorders: Secondary | ICD-10-CM | POA: Diagnosis not present

## 2016-11-25 DIAGNOSIS — F0281 Dementia in other diseases classified elsewhere with behavioral disturbance: Secondary | ICD-10-CM | POA: Diagnosis not present

## 2016-11-26 DIAGNOSIS — K5900 Constipation, unspecified: Secondary | ICD-10-CM | POA: Diagnosis not present

## 2016-11-26 DIAGNOSIS — R451 Restlessness and agitation: Secondary | ICD-10-CM | POA: Diagnosis not present

## 2016-11-26 DIAGNOSIS — R9431 Abnormal electrocardiogram [ECG] [EKG]: Secondary | ICD-10-CM | POA: Diagnosis not present

## 2016-11-26 DIAGNOSIS — F329 Major depressive disorder, single episode, unspecified: Secondary | ICD-10-CM | POA: Diagnosis not present

## 2016-11-26 DIAGNOSIS — H409 Unspecified glaucoma: Secondary | ICD-10-CM | POA: Diagnosis not present

## 2016-11-26 DIAGNOSIS — Z9849 Cataract extraction status, unspecified eye: Secondary | ICD-10-CM | POA: Diagnosis not present

## 2016-11-26 DIAGNOSIS — H9190 Unspecified hearing loss, unspecified ear: Secondary | ICD-10-CM | POA: Diagnosis not present

## 2016-11-26 DIAGNOSIS — R000 Tachycardia, unspecified: Secondary | ICD-10-CM | POA: Diagnosis not present

## 2016-11-26 DIAGNOSIS — H3530 Unspecified macular degeneration: Secondary | ICD-10-CM | POA: Diagnosis not present

## 2016-11-26 DIAGNOSIS — I509 Heart failure, unspecified: Secondary | ICD-10-CM | POA: Diagnosis not present

## 2016-11-26 DIAGNOSIS — F0391 Unspecified dementia with behavioral disturbance: Secondary | ICD-10-CM | POA: Diagnosis not present

## 2016-11-26 DIAGNOSIS — I10 Essential (primary) hypertension: Secondary | ICD-10-CM | POA: Diagnosis not present

## 2016-11-26 DIAGNOSIS — E860 Dehydration: Secondary | ICD-10-CM | POA: Diagnosis not present

## 2016-11-26 DIAGNOSIS — J189 Pneumonia, unspecified organism: Secondary | ICD-10-CM | POA: Diagnosis not present

## 2016-11-26 DIAGNOSIS — I110 Hypertensive heart disease with heart failure: Secondary | ICD-10-CM | POA: Diagnosis not present

## 2016-11-26 DIAGNOSIS — F419 Anxiety disorder, unspecified: Secondary | ICD-10-CM | POA: Diagnosis not present

## 2016-11-26 DIAGNOSIS — Z8673 Personal history of transient ischemic attack (TIA), and cerebral infarction without residual deficits: Secondary | ICD-10-CM | POA: Diagnosis not present

## 2016-11-26 DIAGNOSIS — R339 Retention of urine, unspecified: Secondary | ICD-10-CM | POA: Diagnosis not present

## 2016-11-26 DIAGNOSIS — I491 Atrial premature depolarization: Secondary | ICD-10-CM | POA: Diagnosis not present

## 2016-11-26 DIAGNOSIS — Z853 Personal history of malignant neoplasm of breast: Secondary | ICD-10-CM | POA: Diagnosis not present

## 2016-11-27 DIAGNOSIS — H9190 Unspecified hearing loss, unspecified ear: Secondary | ICD-10-CM | POA: Diagnosis not present

## 2016-11-27 DIAGNOSIS — R000 Tachycardia, unspecified: Secondary | ICD-10-CM | POA: Diagnosis not present

## 2016-11-27 DIAGNOSIS — Z8611 Personal history of tuberculosis: Secondary | ICD-10-CM | POA: Diagnosis not present

## 2016-11-27 DIAGNOSIS — Z7902 Long term (current) use of antithrombotics/antiplatelets: Secondary | ICD-10-CM | POA: Diagnosis not present

## 2016-11-27 DIAGNOSIS — Z8673 Personal history of transient ischemic attack (TIA), and cerebral infarction without residual deficits: Secondary | ICD-10-CM | POA: Diagnosis not present

## 2016-11-27 DIAGNOSIS — F0151 Vascular dementia with behavioral disturbance: Secondary | ICD-10-CM | POA: Diagnosis not present

## 2016-11-27 DIAGNOSIS — K5900 Constipation, unspecified: Secondary | ICD-10-CM | POA: Diagnosis not present

## 2016-11-27 DIAGNOSIS — H3530 Unspecified macular degeneration: Secondary | ICD-10-CM | POA: Diagnosis not present

## 2016-11-27 DIAGNOSIS — R0600 Dyspnea, unspecified: Secondary | ICD-10-CM | POA: Diagnosis not present

## 2016-11-27 DIAGNOSIS — R339 Retention of urine, unspecified: Secondary | ICD-10-CM | POA: Diagnosis not present

## 2016-11-27 DIAGNOSIS — F22 Delusional disorders: Secondary | ICD-10-CM | POA: Diagnosis not present

## 2016-11-27 DIAGNOSIS — N3946 Mixed incontinence: Secondary | ICD-10-CM | POA: Diagnosis not present

## 2016-11-27 DIAGNOSIS — H409 Unspecified glaucoma: Secondary | ICD-10-CM | POA: Diagnosis not present

## 2016-11-27 DIAGNOSIS — Z882 Allergy status to sulfonamides status: Secondary | ICD-10-CM | POA: Diagnosis not present

## 2016-11-27 DIAGNOSIS — F419 Anxiety disorder, unspecified: Secondary | ICD-10-CM | POA: Diagnosis not present

## 2016-11-27 DIAGNOSIS — I110 Hypertensive heart disease with heart failure: Secondary | ICD-10-CM | POA: Diagnosis not present

## 2016-11-27 DIAGNOSIS — Z853 Personal history of malignant neoplasm of breast: Secondary | ICD-10-CM | POA: Diagnosis not present

## 2016-11-27 DIAGNOSIS — I509 Heart failure, unspecified: Secondary | ICD-10-CM | POA: Diagnosis not present

## 2016-11-27 DIAGNOSIS — Z8249 Family history of ischemic heart disease and other diseases of the circulatory system: Secondary | ICD-10-CM | POA: Diagnosis not present

## 2016-11-27 DIAGNOSIS — F329 Major depressive disorder, single episode, unspecified: Secondary | ICD-10-CM | POA: Diagnosis not present

## 2016-11-27 DIAGNOSIS — F0391 Unspecified dementia with behavioral disturbance: Secondary | ICD-10-CM | POA: Diagnosis not present

## 2016-11-27 DIAGNOSIS — I10 Essential (primary) hypertension: Secondary | ICD-10-CM | POA: Diagnosis not present

## 2016-11-28 DIAGNOSIS — F22 Delusional disorders: Secondary | ICD-10-CM | POA: Diagnosis not present

## 2016-11-29 DIAGNOSIS — I10 Essential (primary) hypertension: Secondary | ICD-10-CM | POA: Diagnosis not present

## 2016-11-29 DIAGNOSIS — R451 Restlessness and agitation: Secondary | ICD-10-CM | POA: Diagnosis not present

## 2016-11-29 DIAGNOSIS — F22 Delusional disorders: Secondary | ICD-10-CM | POA: Diagnosis not present

## 2016-11-29 DIAGNOSIS — N3946 Mixed incontinence: Secondary | ICD-10-CM | POA: Diagnosis not present

## 2016-11-29 DIAGNOSIS — F0391 Unspecified dementia with behavioral disturbance: Secondary | ICD-10-CM | POA: Diagnosis not present

## 2016-11-30 DIAGNOSIS — F0391 Unspecified dementia with behavioral disturbance: Secondary | ICD-10-CM | POA: Diagnosis not present

## 2016-12-02 DIAGNOSIS — E559 Vitamin D deficiency, unspecified: Secondary | ICD-10-CM | POA: Diagnosis not present

## 2016-12-02 DIAGNOSIS — G459 Transient cerebral ischemic attack, unspecified: Secondary | ICD-10-CM | POA: Diagnosis not present

## 2016-12-02 DIAGNOSIS — F22 Delusional disorders: Secondary | ICD-10-CM | POA: Diagnosis not present

## 2016-12-02 DIAGNOSIS — R339 Retention of urine, unspecified: Secondary | ICD-10-CM | POA: Diagnosis not present

## 2016-12-02 DIAGNOSIS — R413 Other amnesia: Secondary | ICD-10-CM | POA: Diagnosis not present

## 2016-12-02 DIAGNOSIS — R451 Restlessness and agitation: Secondary | ICD-10-CM | POA: Diagnosis not present

## 2016-12-02 DIAGNOSIS — I10 Essential (primary) hypertension: Secondary | ICD-10-CM | POA: Diagnosis not present

## 2016-12-02 DIAGNOSIS — E871 Hypo-osmolality and hyponatremia: Secondary | ICD-10-CM | POA: Diagnosis not present

## 2016-12-03 DIAGNOSIS — I50810 Right heart failure, unspecified: Secondary | ICD-10-CM | POA: Diagnosis not present

## 2016-12-03 DIAGNOSIS — Z853 Personal history of malignant neoplasm of breast: Secondary | ICD-10-CM | POA: Diagnosis not present

## 2016-12-03 DIAGNOSIS — R339 Retention of urine, unspecified: Secondary | ICD-10-CM | POA: Diagnosis not present

## 2016-12-03 DIAGNOSIS — F329 Major depressive disorder, single episode, unspecified: Secondary | ICD-10-CM | POA: Diagnosis not present

## 2016-12-03 DIAGNOSIS — R262 Difficulty in walking, not elsewhere classified: Secondary | ICD-10-CM | POA: Diagnosis not present

## 2016-12-03 DIAGNOSIS — Z8673 Personal history of transient ischemic attack (TIA), and cerebral infarction without residual deficits: Secondary | ICD-10-CM | POA: Diagnosis not present

## 2016-12-03 DIAGNOSIS — I110 Hypertensive heart disease with heart failure: Secondary | ICD-10-CM | POA: Diagnosis not present

## 2016-12-03 DIAGNOSIS — G5620 Lesion of ulnar nerve, unspecified upper limb: Secondary | ICD-10-CM | POA: Diagnosis not present

## 2016-12-03 DIAGNOSIS — R413 Other amnesia: Secondary | ICD-10-CM | POA: Diagnosis not present

## 2016-12-03 DIAGNOSIS — Z466 Encounter for fitting and adjustment of urinary device: Secondary | ICD-10-CM | POA: Diagnosis not present

## 2016-12-06 DIAGNOSIS — N390 Urinary tract infection, site not specified: Secondary | ICD-10-CM | POA: Diagnosis not present

## 2016-12-06 DIAGNOSIS — R339 Retention of urine, unspecified: Secondary | ICD-10-CM | POA: Diagnosis not present

## 2016-12-10 DIAGNOSIS — R300 Dysuria: Secondary | ICD-10-CM | POA: Diagnosis not present

## 2016-12-20 DIAGNOSIS — R339 Retention of urine, unspecified: Secondary | ICD-10-CM | POA: Diagnosis not present

## 2016-12-20 DIAGNOSIS — K5900 Constipation, unspecified: Secondary | ICD-10-CM | POA: Diagnosis not present

## 2016-12-24 ENCOUNTER — Ambulatory Visit: Payer: PPO | Admitting: Sports Medicine

## 2017-01-05 DIAGNOSIS — R1310 Dysphagia, unspecified: Secondary | ICD-10-CM | POA: Diagnosis not present

## 2017-01-05 DIAGNOSIS — R1032 Left lower quadrant pain: Secondary | ICD-10-CM | POA: Diagnosis not present

## 2017-01-05 DIAGNOSIS — Z7901 Long term (current) use of anticoagulants: Secondary | ICD-10-CM | POA: Diagnosis not present

## 2017-01-11 DIAGNOSIS — E538 Deficiency of other specified B group vitamins: Secondary | ICD-10-CM | POA: Diagnosis not present

## 2017-01-11 DIAGNOSIS — G459 Transient cerebral ischemic attack, unspecified: Secondary | ICD-10-CM | POA: Diagnosis not present

## 2017-01-11 DIAGNOSIS — R339 Retention of urine, unspecified: Secondary | ICD-10-CM | POA: Diagnosis not present

## 2017-01-11 DIAGNOSIS — E559 Vitamin D deficiency, unspecified: Secondary | ICD-10-CM | POA: Diagnosis not present

## 2017-01-11 DIAGNOSIS — Z79899 Other long term (current) drug therapy: Secondary | ICD-10-CM | POA: Diagnosis not present

## 2017-01-11 DIAGNOSIS — I10 Essential (primary) hypertension: Secondary | ICD-10-CM | POA: Diagnosis not present

## 2017-01-11 DIAGNOSIS — F22 Delusional disorders: Secondary | ICD-10-CM | POA: Diagnosis not present

## 2017-01-15 DIAGNOSIS — R4182 Altered mental status, unspecified: Secondary | ICD-10-CM | POA: Diagnosis not present

## 2017-01-15 DIAGNOSIS — R451 Restlessness and agitation: Secondary | ICD-10-CM | POA: Diagnosis not present

## 2017-01-15 DIAGNOSIS — F29 Unspecified psychosis not due to a substance or known physiological condition: Secondary | ICD-10-CM | POA: Diagnosis not present

## 2017-01-15 DIAGNOSIS — E860 Dehydration: Secondary | ICD-10-CM | POA: Diagnosis not present

## 2017-01-15 DIAGNOSIS — R410 Disorientation, unspecified: Secondary | ICD-10-CM | POA: Diagnosis not present

## 2017-01-16 DIAGNOSIS — I6930 Unspecified sequelae of cerebral infarction: Secondary | ICD-10-CM | POA: Diagnosis not present

## 2017-01-16 DIAGNOSIS — I10 Essential (primary) hypertension: Secondary | ICD-10-CM | POA: Diagnosis not present

## 2017-01-16 DIAGNOSIS — M1991 Primary osteoarthritis, unspecified site: Secondary | ICD-10-CM | POA: Diagnosis not present

## 2017-01-16 DIAGNOSIS — Z8611 Personal history of tuberculosis: Secondary | ICD-10-CM | POA: Diagnosis not present

## 2017-01-16 DIAGNOSIS — E559 Vitamin D deficiency, unspecified: Secondary | ICD-10-CM | POA: Diagnosis not present

## 2017-01-16 DIAGNOSIS — E860 Dehydration: Secondary | ICD-10-CM | POA: Diagnosis not present

## 2017-01-16 DIAGNOSIS — R4182 Altered mental status, unspecified: Secondary | ICD-10-CM | POA: Diagnosis not present

## 2017-01-16 DIAGNOSIS — F419 Anxiety disorder, unspecified: Secondary | ICD-10-CM | POA: Diagnosis not present

## 2017-01-16 DIAGNOSIS — Z882 Allergy status to sulfonamides status: Secondary | ICD-10-CM | POA: Diagnosis not present

## 2017-01-16 DIAGNOSIS — M1990 Unspecified osteoarthritis, unspecified site: Secondary | ICD-10-CM | POA: Diagnosis not present

## 2017-01-16 DIAGNOSIS — Z66 Do not resuscitate: Secondary | ICD-10-CM | POA: Diagnosis not present

## 2017-01-16 DIAGNOSIS — R627 Adult failure to thrive: Secondary | ICD-10-CM | POA: Diagnosis not present

## 2017-01-16 DIAGNOSIS — Z885 Allergy status to narcotic agent status: Secondary | ICD-10-CM | POA: Diagnosis not present

## 2017-01-16 DIAGNOSIS — K219 Gastro-esophageal reflux disease without esophagitis: Secondary | ICD-10-CM | POA: Diagnosis not present

## 2017-01-16 DIAGNOSIS — X58XXXA Exposure to other specified factors, initial encounter: Secondary | ICD-10-CM | POA: Diagnosis not present

## 2017-01-16 DIAGNOSIS — K5909 Other constipation: Secondary | ICD-10-CM | POA: Diagnosis not present

## 2017-01-16 DIAGNOSIS — I6789 Other cerebrovascular disease: Secondary | ICD-10-CM | POA: Diagnosis not present

## 2017-01-16 DIAGNOSIS — F418 Other specified anxiety disorders: Secondary | ICD-10-CM | POA: Diagnosis not present

## 2017-01-16 DIAGNOSIS — J690 Pneumonitis due to inhalation of food and vomit: Secondary | ICD-10-CM | POA: Diagnosis not present

## 2017-01-16 DIAGNOSIS — I69951 Hemiplegia and hemiparesis following unspecified cerebrovascular disease affecting right dominant side: Secondary | ICD-10-CM | POA: Diagnosis not present

## 2017-01-16 DIAGNOSIS — N390 Urinary tract infection, site not specified: Secondary | ICD-10-CM | POA: Diagnosis not present

## 2017-01-16 DIAGNOSIS — Z853 Personal history of malignant neoplasm of breast: Secondary | ICD-10-CM | POA: Diagnosis not present

## 2017-01-16 DIAGNOSIS — Z881 Allergy status to other antibiotic agents status: Secondary | ICD-10-CM | POA: Diagnosis not present

## 2017-01-16 DIAGNOSIS — R1310 Dysphagia, unspecified: Secondary | ICD-10-CM | POA: Diagnosis not present

## 2017-01-16 DIAGNOSIS — G4700 Insomnia, unspecified: Secondary | ICD-10-CM | POA: Diagnosis not present

## 2017-01-16 DIAGNOSIS — R740 Nonspecific elevation of levels of transaminase and lactic acid dehydrogenase [LDH]: Secondary | ICD-10-CM | POA: Diagnosis not present

## 2017-01-16 DIAGNOSIS — F0150 Vascular dementia without behavioral disturbance: Secondary | ICD-10-CM | POA: Diagnosis not present

## 2017-01-16 DIAGNOSIS — Z515 Encounter for palliative care: Secondary | ICD-10-CM | POA: Diagnosis not present

## 2017-01-16 DIAGNOSIS — Z79899 Other long term (current) drug therapy: Secondary | ICD-10-CM | POA: Diagnosis not present

## 2017-01-16 DIAGNOSIS — H409 Unspecified glaucoma: Secondary | ICD-10-CM | POA: Diagnosis not present

## 2017-01-16 DIAGNOSIS — R531 Weakness: Secondary | ICD-10-CM | POA: Diagnosis not present

## 2017-01-16 DIAGNOSIS — Z7902 Long term (current) use of antithrombotics/antiplatelets: Secondary | ICD-10-CM | POA: Diagnosis not present

## 2017-01-17 DIAGNOSIS — R000 Tachycardia, unspecified: Secondary | ICD-10-CM | POA: Diagnosis not present

## 2017-01-17 DIAGNOSIS — Z7401 Bed confinement status: Secondary | ICD-10-CM | POA: Diagnosis not present

## 2017-01-17 DIAGNOSIS — R279 Unspecified lack of coordination: Secondary | ICD-10-CM | POA: Diagnosis not present

## 2017-02-22 DEATH — deceased

## 2017-12-20 NOTE — Progress Notes (Signed)
This encounter was created in error - please disregard.
